# Patient Record
Sex: Male | Born: 1985 | Race: White | Hispanic: No | Marital: Single | Smoking: Never smoker
Health system: Southern US, Community
[De-identification: ages and names within clinical notes are randomized; demographics above are authoritative.]

## PROBLEM LIST (undated history)

## (undated) DIAGNOSIS — E271 Primary adrenocortical insufficiency: Secondary | ICD-10-CM

## (undated) HISTORY — PX: FRACTURE SURGERY: SHX138

## (undated) HISTORY — DX: Primary adrenocortical insufficiency: E27.1

## (undated) HISTORY — PX: WISDOM TOOTH EXTRACTION: SHX21

---

## 2005-02-27 ENCOUNTER — Emergency Department: Payer: Self-pay | Admitting: Emergency Medicine

## 2008-05-23 ENCOUNTER — Emergency Department: Payer: Self-pay | Admitting: Emergency Medicine

## 2008-11-07 ENCOUNTER — Emergency Department: Payer: Self-pay | Admitting: Emergency Medicine

## 2009-08-15 ENCOUNTER — Ambulatory Visit: Payer: Self-pay | Admitting: General Surgery

## 2010-03-23 ENCOUNTER — Emergency Department: Payer: Self-pay | Admitting: Emergency Medicine

## 2010-12-08 HISTORY — PX: EPIGASTRIC HERNIA REPAIR: SUR1177

## 2011-02-11 ENCOUNTER — Emergency Department: Payer: Self-pay | Admitting: Emergency Medicine

## 2011-10-15 ENCOUNTER — Inpatient Hospital Stay: Payer: Self-pay | Admitting: Internal Medicine

## 2014-12-19 ENCOUNTER — Emergency Department: Payer: Self-pay | Admitting: Emergency Medicine

## 2014-12-19 LAB — COMPREHENSIVE METABOLIC PANEL
ALK PHOS: 77 U/L
Albumin: 4 g/dL (ref 3.4–5.0)
Anion Gap: 8 (ref 7–16)
BUN: 11 mg/dL (ref 7–18)
Bilirubin,Total: 2.1 mg/dL — ABNORMAL HIGH (ref 0.2–1.0)
CALCIUM: 8.5 mg/dL (ref 8.5–10.1)
CHLORIDE: 103 mmol/L (ref 98–107)
Co2: 25 mmol/L (ref 21–32)
Creatinine: 0.99 mg/dL (ref 0.60–1.30)
EGFR (African American): >60
Glucose: 73 mg/dL (ref 65–99)
Osmolality: 270 (ref 275–301)
Potassium: 3.7 mmol/L (ref 3.5–5.1)
SGOT(AST): 37 U/L (ref 15–37)
SGPT (ALT): 27 U/L
SODIUM: 136 mmol/L (ref 136–145)
Total Protein: 7.1 g/dL (ref 6.4–8.2)

## 2014-12-19 LAB — CBC
HCT: 42.9 % (ref 40.0–52.0)
HGB: 14.4 g/dL (ref 13.0–18.0)
MCH: 29 pg (ref 26.0–34.0)
MCHC: 33.4 g/dL (ref 32.0–36.0)
MCV: 87 fL (ref 80–100)
Platelet: 264 10*3/uL (ref 150–440)
RBC: 4.96 10*6/uL (ref 4.40–5.90)
RDW: 12.7 % (ref 11.5–14.5)
WBC: 4.2 10*3/uL (ref 3.8–10.6)

## 2014-12-19 LAB — IRON: Iron: 87 ug/dL (ref 65–175)

## 2014-12-19 LAB — FERRITIN: FERRITIN (ARMC): 224 ng/mL (ref 8–388)

## 2014-12-25 ENCOUNTER — Emergency Department: Payer: Self-pay | Admitting: Emergency Medicine

## 2014-12-25 LAB — COMPREHENSIVE METABOLIC PANEL
ALT: 44 U/L
Albumin: 4.6 g/dL (ref 3.4–5.0)
Alkaline Phosphatase: 81 U/L
Anion Gap: 9 (ref 7–16)
BUN: 17 mg/dL (ref 7–18)
Bilirubin,Total: 1.4 mg/dL — ABNORMAL HIGH (ref 0.2–1.0)
CALCIUM: 9.5 mg/dL (ref 8.5–10.1)
CO2: 25 mmol/L (ref 21–32)
Chloride: 100 mmol/L (ref 98–107)
Creatinine: 1 mg/dL (ref 0.60–1.30)
Glucose: 114 mg/dL — ABNORMAL HIGH (ref 65–99)
Osmolality: 271 (ref 275–301)
Potassium: 4 mmol/L (ref 3.5–5.1)
SGOT(AST): 41 U/L — ABNORMAL HIGH (ref 15–37)
Sodium: 134 mmol/L — ABNORMAL LOW (ref 136–145)
Total Protein: 8.2 g/dL (ref 6.4–8.2)

## 2014-12-25 LAB — CBC WITH DIFFERENTIAL/PLATELET
Basophil #: 0 10*3/uL (ref 0.0–0.1)
Basophil %: 0.3 %
EOS ABS: 0.2 10*3/uL (ref 0.0–0.7)
Eosinophil %: 1.4 %
HCT: 50.9 % (ref 40.0–52.0)
HGB: 16.9 g/dL (ref 13.0–18.0)
Lymphocyte #: 0.9 10*3/uL — ABNORMAL LOW (ref 1.0–3.6)
Lymphocyte %: 7.7 %
MCH: 29 pg (ref 26.0–34.0)
MCHC: 33.2 g/dL (ref 32.0–36.0)
MCV: 87 fL (ref 80–100)
Monocyte #: 0.3 x10 3/mm (ref 0.2–1.0)
Monocyte %: 2.1 %
NEUTROS PCT: 88.5 %
Neutrophil #: 10.7 10*3/uL — ABNORMAL HIGH (ref 1.4–6.5)
PLATELETS: 295 10*3/uL (ref 150–440)
RBC: 5.82 10*6/uL (ref 4.40–5.90)
RDW: 12.7 % (ref 11.5–14.5)
WBC: 12.1 10*3/uL — ABNORMAL HIGH (ref 3.8–10.6)

## 2014-12-25 LAB — URINALYSIS, COMPLETE
Bacteria: NONE SEEN
Bilirubin,UR: NEGATIVE
Blood: NEGATIVE
Glucose,UR: NEGATIVE mg/dL (ref 0–75)
KETONE: NEGATIVE
Leukocyte Esterase: NEGATIVE
Nitrite: NEGATIVE
PH: 5 (ref 4.5–8.0)
PROTEIN: NEGATIVE
RBC,UR: <1 /HPF (ref 0–5)
SPECIFIC GRAVITY: 1.023 (ref 1.003–1.030)
Squamous Epithelial: <1
WBC UR: 1 /HPF (ref 0–5)

## 2014-12-25 LAB — LIPASE, BLOOD: Lipase: 94 U/L (ref 73–393)

## 2016-07-07 ENCOUNTER — Other Ambulatory Visit: Payer: Self-pay | Admitting: Surgery

## 2016-07-07 DIAGNOSIS — S43432A Superior glenoid labrum lesion of left shoulder, initial encounter: Secondary | ICD-10-CM

## 2016-07-07 DIAGNOSIS — M7582 Other shoulder lesions, left shoulder: Secondary | ICD-10-CM

## 2016-07-07 DIAGNOSIS — S46912A Strain of unspecified muscle, fascia and tendon at shoulder and upper arm level, left arm, initial encounter: Secondary | ICD-10-CM | POA: Diagnosis not present

## 2016-07-07 DIAGNOSIS — M25512 Pain in left shoulder: Secondary | ICD-10-CM | POA: Diagnosis not present

## 2016-07-22 ENCOUNTER — Ambulatory Visit
Admission: RE | Admit: 2016-07-22 | Discharge: 2016-07-22 | Disposition: A | Discharge status: Home / Self Care | Payer: BLUE CROSS/BLUE SHIELD | Source: Ambulatory Visit | Attending: Surgery | Admitting: Surgery

## 2016-07-22 ENCOUNTER — Encounter
Admission: RE | Admit: 2016-07-22 | Discharge: 2016-07-22 | Disposition: A | Discharge status: Home / Self Care | Payer: BLUE CROSS/BLUE SHIELD | Source: Ambulatory Visit | Attending: Surgery | Admitting: Surgery

## 2016-07-22 DIAGNOSIS — M25512 Pain in left shoulder: Secondary | ICD-10-CM | POA: Insufficient documentation

## 2016-07-22 DIAGNOSIS — M7582 Other shoulder lesions, left shoulder: Secondary | ICD-10-CM

## 2016-07-22 DIAGNOSIS — S43432A Superior glenoid labrum lesion of left shoulder, initial encounter: Secondary | ICD-10-CM | POA: Insufficient documentation

## 2016-07-22 DIAGNOSIS — S46912A Strain of unspecified muscle, fascia and tendon at shoulder and upper arm level, left arm, initial encounter: Secondary | ICD-10-CM

## 2016-07-22 MED ORDER — LIDOCAINE HCL (PF) 1 % IJ SOLN
5.0000 mL | Freq: Once | INTRAMUSCULAR | Status: DC
  Filled 2016-07-22: qty 5

## 2016-07-22 MED ORDER — IOPAMIDOL (ISOVUE-300) INJECTION 61%: 5.0000 mL | Freq: Once | INTRAVENOUS | Status: DC | PRN

## 2016-07-22 MED ORDER — GADOBENATE DIMEGLUMINE 529 MG/ML IV SOLN
0.1000 mL | Freq: Once | INTRAVENOUS | Status: AC | PRN
  Administered 2016-07-22: 0.1 mL via INTRAVENOUS

## 2016-08-26 DIAGNOSIS — S46102A Unspecified injury of muscle, fascia and tendon of long head of biceps, left arm, initial encounter: Secondary | ICD-10-CM | POA: Insufficient documentation

## 2016-09-10 DIAGNOSIS — E271 Primary adrenocortical insufficiency: Secondary | ICD-10-CM | POA: Diagnosis not present

## 2017-07-06 ENCOUNTER — Ambulatory Visit
Admission: RE | Admit: 2017-07-06 | Discharge: 2017-07-06 | Disposition: A | Discharge status: Home / Self Care | Payer: BLUE CROSS/BLUE SHIELD | Source: Ambulatory Visit | Attending: Family Medicine | Admitting: Family Medicine

## 2017-07-06 ENCOUNTER — Telehealth: Payer: Self-pay | Admitting: Family Medicine

## 2017-07-06 ENCOUNTER — Encounter: Payer: Self-pay | Admitting: Family Medicine

## 2017-07-06 ENCOUNTER — Inpatient Hospital Stay: Admit: 2017-07-06 | Payer: Self-pay

## 2017-07-06 ENCOUNTER — Ambulatory Visit (INDEPENDENT_AMBULATORY_CARE_PROVIDER_SITE_OTHER): Payer: BLUE CROSS/BLUE SHIELD | Admitting: Family Medicine

## 2017-07-06 VITALS — BP 125/81 | HR 81 | Temp 98.5°F | Ht 66.0 in | Wt 156.0 lb

## 2017-07-06 DIAGNOSIS — M4184 Other forms of scoliosis, thoracic region: Secondary | ICD-10-CM | POA: Insufficient documentation

## 2017-07-06 DIAGNOSIS — R0602 Shortness of breath: Secondary | ICD-10-CM | POA: Diagnosis not present

## 2017-07-06 DIAGNOSIS — E271 Primary adrenocortical insufficiency: Secondary | ICD-10-CM | POA: Insufficient documentation

## 2017-07-06 MED ORDER — ALBUTEROL SULFATE (2.5 MG/3ML) 0.083% IN NEBU
2.5000 mg | INHALATION_SOLUTION | Freq: Once | RESPIRATORY_TRACT | Status: AC
  Administered 2017-07-06: 2.5 mg via RESPIRATORY_TRACT

## 2017-07-06 NOTE — Progress Notes (Signed)
BP 125/81   Pulse 81   Temp 98.5 F (36.9 C)   Ht 5\' 6"  (1.676 m)   Wt 156 lb (70.8 kg)   SpO2 96%   BMI 25.18 kg/m    Subjective:    Patient ID: Gregory Wood Jawad Jr., male    DOB: 1986/05/06, 31 y.o.   MRN: [id number]  HPI: Gregory CableDouglas Wood Pendelton Jr. is a 31 y.o. male  Chief Complaint  Patient presents with  . Shortness of Breath    started when he was prescribed Cortef approx 2 years ago, but it comes and goes. Feels like he can't take a deep breath.    Patient with 2 years of intermittent episodes of chest tightness and SOB. These sxs started once he started the cortef after being diagnosed with Addison's disease. No apparent pattern for flares, and states they are worst when he first wakes up and seem to improve with activity. Does have dry coughing spells with these episodes. Denies reflux, exercise intolerance, CP, hx of allergic rhinitis, asthma, or other pulmonary dz. Never smoked cigarettes. Does occasionally smoke marijuana and actually notes benefit from this regarding his breathing.   Not currently having sxs, but states yesterday had severe sxs to the point where he thought about going to the hospital. Has discussed this issue with Endocrinologist at times who have recommended he see Pulmonology but pt never followed up since sxs dissipated at that time.  Relevant past medical, surgical, family and social history reviewed and updated as indicated. Interim medical history since our last visit reviewed. Allergies and medications reviewed and updated.  Review of Systems  Constitutional: Negative.   HENT: Negative.   Eyes: Negative.   Respiratory: Positive for cough, chest tightness and shortness of breath. Negative for wheezing.   Cardiovascular: Negative.   Gastrointestinal: Negative.   Musculoskeletal: Negative.   Neurological: Negative.   Psychiatric/Behavioral: Negative.    Per HPI unless specifically indicated above     Objective:    BP 125/81   Pulse 81    Temp 98.5 F (36.9 C)   Ht 5\' 6"  (1.676 m)   Wt 156 lb (70.8 kg)   SpO2 96%   BMI 25.18 kg/m   Wt Readings from Last 3 Encounters:  07/06/17 156 lb (70.8 kg)    Physical Exam  Constitutional: He is oriented to person, place, and time. He appears well-developed and well-nourished. No distress.  HENT:  Head: Atraumatic.  Nose: Nose normal.  Mouth/Throat: Oropharynx is clear and moist.  Eyes: Pupils are equal, round, and reactive to light. Conjunctivae are normal. No scleral icterus.  Neck: Normal range of motion. Neck supple.  Cardiovascular: Normal rate, regular rhythm and normal heart sounds.   Pulmonary/Chest: Effort normal and breath sounds normal. No respiratory distress. He has no wheezes. He has no rales.  Musculoskeletal: Normal range of motion.  Neurological: He is alert and oriented to person, place, and time.  Skin: Skin is warm and dry.  Psychiatric: He has a normal mood and affect. His behavior is normal.  Nursing note and vitals reviewed.     Assessment & Plan:   Problem List Items Addressed This Visit    None    Visit Diagnoses    SOB (shortness of breath)    -  Primary   Spirometry with moderate restriction not improved w/ neb. Will obtain CXR and refer to pulmonology for further eval.    Relevant Medications   albuterol (PROVENTIL) (2.5 MG/3ML) 0.083% nebulizer solution  2.5 mg (Completed)   Other Relevant Orders   Spirometry with Graph (Completed)   PR DEMO &/OR EVAL,PT USE,AEROSOL DEVICE   DG Chest 2 View (Completed)   Ambulatory referral to Pulmonology       Follow up plan: Return for Pulmonology.

## 2017-07-06 NOTE — Telephone Encounter (Signed)
Patient notified

## 2017-07-06 NOTE — Telephone Encounter (Signed)
Please call and let him know chest x-ray came back normal

## 2017-07-06 NOTE — Patient Instructions (Signed)
Follow up depending on results 

## 2017-07-07 ENCOUNTER — Encounter: Payer: Self-pay | Admitting: Internal Medicine

## 2017-07-07 ENCOUNTER — Ambulatory Visit (INDEPENDENT_AMBULATORY_CARE_PROVIDER_SITE_OTHER): Payer: BLUE CROSS/BLUE SHIELD | Admitting: Internal Medicine

## 2017-07-07 VITALS — BP 138/80 | HR 89 | Resp 16 | Ht 66.0 in | Wt 157.0 lb

## 2017-07-07 DIAGNOSIS — G4719 Other hypersomnia: Secondary | ICD-10-CM | POA: Diagnosis not present

## 2017-07-07 DIAGNOSIS — R0602 Shortness of breath: Secondary | ICD-10-CM | POA: Diagnosis not present

## 2017-07-07 MED ORDER — TIOTROPIUM BROMIDE MONOHYDRATE 1.25 MCG/ACT IN AERS: 2.0000 | INHALATION_SPRAY | Freq: Two times a day (BID) | RESPIRATORY_TRACT | 5 refills | Status: DC

## 2017-07-07 NOTE — Patient Instructions (Addendum)
CT chest without Contrast Will need SLeep study

## 2017-07-07 NOTE — Progress Notes (Signed)
Name: Gregory CableDouglas L Ericksen Jr. MRN: [id number] DOB: [birth year]     CONSULTATION DATE: 07/07/17  REFERRING MD : Dossie Arbourcrissman CHIEF COMPLAINT:  Shortness breath  STUDIES:  Chest x-ray on 07/06/2017 reviewed on 07/07/2017 Interpretation no acute infiltrates noted however there is evidence of scoliosis   HISTORY OF PRESENT ILLNESS:   31 year old white male presents today with waking up with shortness of breath it's been going on for the last several months has associated wheezing and cough Patient has been having occasional dyspnea exertion patient has been exposed to secondhand smoke in the past Patient does use marijuana occasionally Patient has no diagnosis of asthma as child  He is on chronic hydrocortisone therapy for Addison's disease Patient has gained 40 pounds in the last 2 years He does have excessive daytime sleepiness and generalized fatigue throughout the day Patient has occasional gasping for air episodes in the middle of the night Epworth sleep score is 13  Chest x-ray does not show any acute infiltrates just some spinal abnormalities with scoliosis however his PFT function is significant for a moderate restrictive lung disease  At this time does not have any signs of infection at this time He does not have any signs of acute heart failure at this time  PAST MEDICAL HISTORY :   has a past medical history of Addison's disease (HCC).  has a past surgical history that includes Wisdom tooth extraction; Fracture surgery; and Epigastric hernia repair (2012). Prior to Admission medications   Medication Sig Start Date End Date Taking? Authorizing Provider  hydrocortisone (CORTEF) 10 MG tablet Take 10 mg by mouth. Take 1.5 in the morning and 1 in the evening. 06/09/17  Yes [provider]   No Known Allergies  FAMILY HISTORY:  family history includes Diabetes in his father; Hypertension in his mother; Stroke in his paternal grandfather. SOCIAL HISTORY:  reports that he  has never smoked. He has never used smokeless tobacco. He reports that he uses drugs, including Marijuana. He reports that he does not drink alcohol.  REVIEW OF SYSTEMS:   Constitutional: Negative for fever, chills, weight loss, +malaise/fatigue and diaphoresis.  HENT: Negative for hearing loss, ear pain, nosebleeds, congestion, sore throat, neck pain, tinnitus and ear discharge.   Eyes: Negative for blurred vision, double vision, photophobia, pain, discharge and redness.  Respiratory: Negative for cough, hemoptysis, sputum production, +shortness of breath, +wheezing and stridor.   Cardiovascular: Negative for chest pain, palpitations, orthopnea, claudication, leg swelling and PND.  Gastrointestinal: Negative for heartburn, nausea, vomiting, abdominal pain, diarrhea, constipation, blood in stool and melena.  Genitourinary: Negative for dysuria, urgency, frequency, hematuria and flank pain.  Musculoskeletal: Negative for myalgias, back pain, joint pain and falls.  Skin: Negative for itching and rash.  Neurological: Negative for dizziness, tingling, tremors, sensory change, speech change, focal weakness, seizures, loss of consciousness, weakness and headaches.  Endo/Heme/Allergies: Negative for environmental allergies and polydipsia. Does not bruise/bleed easily.  ALL OTHER ROS ARE NEGATIVE  BP 138/80 (BP Location: Left Arm, Cuff Size: Normal)   Pulse 89   Resp 16   Ht 5\' 6"  (1.676 m)   Wt 157 lb (71.2 kg)   SpO2 96%   BMI 25.34 kg/m    Physical Examination:   GENERAL:NAD, no fevers, chills, no weakness no fatigue HEAD: Normocephalic, atraumatic.  EYES: Pupils equal, round, reactive to light. Extraocular muscles intact. No scleral icterus.  MOUTH: Moist mucosal membrane.   EAR, NOSE, THROAT: Clear without exudates. No external lesions.  NECK: Supple.  No thyromegaly. No nodules. No JVD.  PULMONARY:CTA B/L no wheezes, no crackles, no rhonchi CARDIOVASCULAR: S1 and S2. Regular rate and  rhythm. No murmurs, rubs, or gallops. No edema.  GASTROINTESTINAL: Soft, nontender, nondistended. No masses. Positive bowel sounds.  MUSCULOSKELETAL: No swelling, clubbing, or edema. Range of motion full in all extremities.  NEUROLOGIC: Cranial nerves II through XII are intact. No gross focal neurological deficits.  SKIN: No ulceration, lesions, rashes, or cyanosis. Skin warm and dry. Turgor intact.  PSYCHIATRIC: Mood, affect within normal limits. The patient is awake, alert and oriented x 3. Insight, judgment intact.     ASSESSMENT / PLAN: 31 year old pleasant white male seen today for shortness of breath wheezing and cough which is most likely related to underlying reactive airways disease with probable underlying asthma in the setting of a diagnosis of Addison's disease on chronic steroid therapy which T 40 pound weight gain and also has symptoms of excessive daytime sleepiness and fatigue throughout the day with respiratory symptoms at nighttime which may suggest underlying obstructive sleep apnea  #1 shortness of breath and wheezing and cough likely related to reactive airways disease Patient advised to avoid secondhand smoke Patient advised to stay away from marijuana use Will show patient how to use Spiriva Respimat 1.25  #2 moderate restrictive lung disease as per office spirometry done at his primary care office This is unusual for a young male even with scoliosis Will need CT of his chest to assess for interstitial lung disease   #3 excessive daytime sleepiness in the setting of fatigue along with respiratory difficulty intermittent at nighttime Findings suggest underlying obstructive sleep apnea Patient will need sleep study as soon as possible  Patient  satisfied with Plan of action and management. All questions answered  Lucie LeatherKurian David Jayd Forrey, M.D.  [facility]  Medical Director [facility] Medical Director [facility]

## 2017-08-05 ENCOUNTER — Ambulatory Visit: Payer: BLUE CROSS/BLUE SHIELD | Attending: Internal Medicine

## 2017-08-05 DIAGNOSIS — R400 Somnolence: Secondary | ICD-10-CM | POA: Insufficient documentation

## 2017-08-05 DIAGNOSIS — E271 Primary adrenocortical insufficiency: Secondary | ICD-10-CM | POA: Diagnosis not present

## 2017-08-05 DIAGNOSIS — G4761 Periodic limb movement disorder: Secondary | ICD-10-CM | POA: Insufficient documentation

## 2017-08-05 DIAGNOSIS — G4714 Hypersomnia due to medical condition: Secondary | ICD-10-CM | POA: Diagnosis not present

## 2017-08-07 ENCOUNTER — Telehealth: Payer: Self-pay | Admitting: *Deleted

## 2017-08-07 NOTE — Telephone Encounter (Signed)
LMOM for pt to call back for results

## 2017-08-07 NOTE — Telephone Encounter (Signed)
-----   Message from Shane CrutchPradeep Ramachandran, MD sent at 08/06/2017  5:25 PM EDT ----- Regarding: Sleep study results.  Sleep study negative for OSA: recommend:  Follow with referring physician .Educate patient in sleep hygiene measures .If OSA is still a concern would consider home sleep testing which may have a better sensitivity at detecting this patient's mild OSA.

## 2017-08-11 NOTE — Telephone Encounter (Signed)
LMOM for pt to return call. 

## 2017-08-12 NOTE — Telephone Encounter (Signed)
Pt informed of sleep results. Pt scheduled to discuss sleep techniques with DK. Nothing further needed.

## 2017-08-20 ENCOUNTER — Encounter: Payer: Self-pay | Admitting: Internal Medicine

## 2017-08-20 ENCOUNTER — Ambulatory Visit (INDEPENDENT_AMBULATORY_CARE_PROVIDER_SITE_OTHER): Payer: BLUE CROSS/BLUE SHIELD | Admitting: Internal Medicine

## 2017-08-20 VITALS — BP 118/90 | HR 91 | Ht 66.0 in | Wt 158.0 lb

## 2017-08-20 DIAGNOSIS — J4520 Mild intermittent asthma, uncomplicated: Secondary | ICD-10-CM | POA: Diagnosis not present

## 2017-08-20 MED ORDER — OMEPRAZOLE 20 MG PO CPDR: 20.0000 mg | DELAYED_RELEASE_CAPSULE | Freq: Every day | ORAL | 5 refills | Status: DC

## 2017-08-20 NOTE — Progress Notes (Signed)
Name: Gregory CableDouglas L Sky Jr. MRN: [id number] DOB: February 15, 1986     CONSULTATION DATE: 07/07/17  REFERRING MD : Dossie Arbourcrissman CHIEF COMPLAINT:  Shortness breath  STUDIES:  Chest x-ray on 07/06/2017 reviewed on 07/07/2017 Interpretation no acute infiltrates noted however there is evidence of scoliosis   HISTORY OF PRESENT ILLNESS:   patient states that his wheezing has improved since he changed mattresses I had started Spiriva. At last visit. However, he did not continue this medicine as it  seemed it did not help He also has h/o GERD and has stopped taking his Prilosec when he started taking his steroids He noticed more GERD symptoms    Patient has no diagnosis of asthma as child  He is on chronic hydrocortisone therapy for Addison's disease Patient has gained 40 pounds in the last 2 years Patient does not have sleep apnea  Chest x-ray does not show any acute infiltrates just some spinal abnormalities with scoliosis however his PFT function is significant for a moderate restrictive lung disease  At this time does not have any signs of infection at this time He does not have any signs of acute heart failure at this time   REVIEW OF SYSTEMS:   Constitutional: Negative for fever, chills, weight loss, -malaise/fatigue and diaphoresis.  HENT: Negative for hearing loss, ear pain, nosebleeds, congestion, sore throat, neck pain, tinnitus and ear discharge.   Eyes: Negative for blurred vision, double vision, photophobia, pain, discharge and redness.  Respiratory: Negative for cough, hemoptysis, sputum production, -shortness of breath, -wheezing and stridor.   Cardiovascular: Negative for chest pain, palpitations, orthopnea, claudication, leg swelling and PND.  Gastrointestinal: +heartburn,  ALL OTHER ROS ARE NEGATIVE  BP 118/90 (BP Location: Left Arm, Cuff Size: Normal)   Pulse 91   Ht 5\' 6"  (1.676 m)   Wt 158 lb (71.7 kg)   SpO2 97%   BMI 25.50 kg/m    Physical Examination:    GENERAL:NAD, no fevers, chills, no weakness no fatigue HEAD: Normocephalic, atraumatic.  EYES: Pupils equal, round, reactive to light. Extraocular muscles intact. No scleral icterus.  MOUTH: Moist mucosal membrane.   EAR, NOSE, THROAT: Clear without exudates. No external lesions.  NECK: Supple. No thyromegaly. No nodules. No JVD.  PULMONARY:CTA B/L no wheezes, no crackles, no rhonchi CARDIOVASCULAR: S1 and S2. Regular rate and rhythm. No murmurs, rubs, or gallops. No edema.     ASSESSMENT / PLAN: 31 year old pleasant white male seen today for shortness of breath wheezing and cough which is most likely related to underlying reactive airways disease with probable underlying asthma in the setting of a diagnosis of Addison's disease on chronic steroid therapy, associated with uncontrolled GERD  #1 shortness of breath and wheezing and cough likely related to reactive airways disease Patient advised to avoid secondhand smoke Patient advised to stay away from marijuana use Patient with uncontrolled GERD  #2 moderate restrictive lung disease as per office spirometry done at his primary care office This is unusual for a young male even with scoliosis CT of his chest to assess for interstitial lung disease to be discussed at next visit since his wheezing and SOB improved  #3 . Uncontrolled GERD Patient advised to restart Prilosec as this could be related to his morning wheezing and feeling sick to his stomach  Patient  satisfied with Plan of action and management. All questions answered Follow-up in 6 months  Gregory Wood Gladavid Reinhart Saulters, M.D.  [facility]  Medical Director [facility] Medical Director  [facility] Cardio-Pulmonary Department

## 2017-08-20 NOTE — Patient Instructions (Signed)
Restart Prilosec therapy for reflux disease

## 2017-08-20 NOTE — Addendum Note (Signed)
Addended by: Alease FrameARTER, Briannah Lona S on: 08/20/2017 04:10 PM   Modules accepted: Orders

## 2017-09-30 DIAGNOSIS — E271 Primary adrenocortical insufficiency: Secondary | ICD-10-CM | POA: Diagnosis not present

## 2017-10-05 DIAGNOSIS — E271 Primary adrenocortical insufficiency: Secondary | ICD-10-CM | POA: Diagnosis not present

## 2018-02-19 ENCOUNTER — Encounter: Payer: Self-pay | Admitting: Internal Medicine

## 2018-02-26 ENCOUNTER — Other Ambulatory Visit: Payer: Self-pay | Admitting: Internal Medicine

## 2018-02-26 MED ORDER — OMEPRAZOLE 20 MG PO CPDR: 20.0000 mg | DELAYED_RELEASE_CAPSULE | Freq: Every day | ORAL | 1 refills | Status: DC

## 2018-02-26 NOTE — Telephone Encounter (Signed)
Paper refill request from [facility] pharmacy. 

## 2018-03-02 ENCOUNTER — Other Ambulatory Visit: Payer: Self-pay

## 2018-03-02 MED ORDER — OMEPRAZOLE 20 MG PO CPDR: 20.0000 mg | DELAYED_RELEASE_CAPSULE | Freq: Every day | ORAL | 0 refills | Status: DC

## 2018-03-02 NOTE — Telephone Encounter (Signed)
Received 90 day supply request for Prilosec 20mg . Rx for Prilosec was sent in on 02/26/18 for 30 day supply. Rx for 90 day supply has been sent to preferred pharmacy.  Nothing further is needed.

## 2019-01-12 DIAGNOSIS — E271 Primary adrenocortical insufficiency: Secondary | ICD-10-CM | POA: Diagnosis not present

## 2019-05-20 DIAGNOSIS — Z20828 Contact with and (suspected) exposure to other viral communicable diseases: Secondary | ICD-10-CM | POA: Diagnosis not present

## 2020-01-24 DIAGNOSIS — Z20828 Contact with and (suspected) exposure to other viral communicable diseases: Secondary | ICD-10-CM | POA: Diagnosis not present

## 2020-03-06 ENCOUNTER — Encounter: Payer: Self-pay | Admitting: Family Medicine

## 2020-03-06 ENCOUNTER — Ambulatory Visit (INDEPENDENT_AMBULATORY_CARE_PROVIDER_SITE_OTHER): Payer: BC Managed Care – PPO | Admitting: Family Medicine

## 2020-03-06 ENCOUNTER — Other Ambulatory Visit: Payer: Self-pay

## 2020-03-06 VITALS — BP 105/68 | HR 76 | Temp 98.2°F

## 2020-03-06 DIAGNOSIS — L739 Follicular disorder, unspecified: Secondary | ICD-10-CM

## 2020-03-06 MED ORDER — SULFAMETHOXAZOLE-TRIMETHOPRIM 800-160 MG PO TABS: 1.0000 | ORAL_TABLET | Freq: Two times a day (BID) | ORAL | 0 refills | Status: DC

## 2020-03-06 NOTE — Progress Notes (Signed)
BP 105/68 (BP Location: Left Arm, Patient Position: Sitting, Cuff Size: Normal)   Pulse 76   Temp 98.2 F (36.8 C) (Oral)   SpO2 97%    Subjective:    Patient ID: Gregory Cable., male    DOB: Apr 09, 1986, 34 y.o.   MRN: [id number]  HPI: Gregory Gillian. is a 34 y.o. male  No chief complaint on file.  SKIN INFECTION Duration: few days Location: tip of his nose History of trauma in area: no Pain: yes Quality: aching and sore Severity: mildH3 Redness: yes Swelling: yes Oozing: no Pus: no Fevers: no Nausea/vomiting: no Status: better Treatments attempted:warm compresses  Tetanus: unknown   Relevant past medical, surgical, family and social history reviewed and updated as indicated. Interim medical history since our last visit reviewed. Allergies and medications reviewed and updated.  Review of Systems  Constitutional: Negative.   HENT: Negative.   Respiratory: Negative.   Cardiovascular: Negative.   Gastrointestinal: Negative.   Musculoskeletal: Negative.   Skin: Positive for color change. Negative for pallor, rash and wound.  Neurological: Negative.   Psychiatric/Behavioral: Negative.     Per HPI unless specifically indicated above     Objective:    BP 105/68 (BP Location: Left Arm, Patient Position: Sitting, Cuff Size: Normal)   Pulse 76   Temp 98.2 F (36.8 C) (Oral)   SpO2 97%   Wt Readings from Last 3 Encounters:  08/20/17 158 lb (71.7 kg)  07/07/17 157 lb (71.2 kg)  07/06/17 156 lb (70.8 kg)    Physical Exam Vitals and nursing note reviewed.  Constitutional:      General: He is not in acute distress.    Appearance: Normal appearance. He is not ill-appearing, toxic-appearing or diaphoretic.  HENT:     Head: Normocephalic and atraumatic.     Right Ear: External ear normal.     Left Ear: External ear normal.     Nose: Nose normal.     Mouth/Throat:     Mouth: Mucous membranes are moist.     Pharynx: Oropharynx is clear.  Eyes:     General: No scleral icterus.       Right eye: No discharge.        Left eye: No discharge.     Extraocular Movements: Extraocular movements intact.     Conjunctiva/sclera: Conjunctivae normal.     Pupils: Pupils are equal, round, and reactive to light.  Cardiovascular:     Rate and Rhythm: Normal rate and regular rhythm.     Pulses: Normal pulses.     Heart sounds: Normal heart sounds. No murmur. No friction rub. No gallop.   Pulmonary:     Effort: Pulmonary effort is normal. No respiratory distress.     Breath sounds: Normal breath sounds. No stridor. No wheezing, rhonchi or rales.  Chest:     Chest wall: No tenderness.  Musculoskeletal:        General: Normal range of motion.     Cervical back: Normal range of motion and neck supple.  Skin:    General: Skin is warm and dry.     Capillary Refill: Capillary refill takes less than 2 seconds.     Coloration: Skin is not jaundiced or pale.     Findings: Erythema (erythematous papule on the tip of his nose) present. No bruising, lesion or rash.  Neurological:     General: No focal deficit present.     Mental Status: He is alert and oriented  to person, place, and time. Mental status is at baseline.  Psychiatric:        Mood and Affect: Mood normal.        Behavior: Behavior normal.        Thought Content: Thought content normal.        Judgment: Judgment normal.     No results found for this or any previous visit.    Assessment & Plan:   Problem List Items Addressed This Visit    None    Visit Diagnoses    Folliculitis    -  Primary   Will treat with bactrim. Call if not getting better or getting worse. Continue to monitor.        Follow up plan: Return physical ASAP.

## 2020-03-14 ENCOUNTER — Encounter: Payer: BC Managed Care – PPO | Admitting: Family Medicine

## 2020-04-06 ENCOUNTER — Other Ambulatory Visit: Payer: Self-pay

## 2020-04-06 ENCOUNTER — Emergency Department
Admission: EM | Admit: 2020-04-06 | Discharge: 2020-04-06 | Disposition: A | Discharge status: Home / Self Care | Payer: No Typology Code available for payment source | Attending: Emergency Medicine | Admitting: Emergency Medicine

## 2020-04-06 ENCOUNTER — Emergency Department: Payer: No Typology Code available for payment source

## 2020-04-06 DIAGNOSIS — S61213A Laceration without foreign body of left middle finger without damage to nail, initial encounter: Secondary | ICD-10-CM | POA: Diagnosis present

## 2020-04-06 DIAGNOSIS — E271 Primary adrenocortical insufficiency: Secondary | ICD-10-CM | POA: Diagnosis not present

## 2020-04-06 DIAGNOSIS — Z23 Encounter for immunization: Secondary | ICD-10-CM | POA: Insufficient documentation

## 2020-04-06 DIAGNOSIS — W231XXA Caught, crushed, jammed, or pinched between stationary objects, initial encounter: Secondary | ICD-10-CM | POA: Insufficient documentation

## 2020-04-06 DIAGNOSIS — Z79899 Other long term (current) drug therapy: Secondary | ICD-10-CM | POA: Diagnosis not present

## 2020-04-06 DIAGNOSIS — Y9259 Other trade areas as the place of occurrence of the external cause: Secondary | ICD-10-CM | POA: Diagnosis not present

## 2020-04-06 DIAGNOSIS — Y9389 Activity, other specified: Secondary | ICD-10-CM | POA: Insufficient documentation

## 2020-04-06 DIAGNOSIS — Y990 Civilian activity done for income or pay: Secondary | ICD-10-CM | POA: Diagnosis not present

## 2020-04-06 MED ORDER — CEPHALEXIN 500 MG PO CAPS: 1000.0000 mg | ORAL_CAPSULE | Freq: Two times a day (BID) | ORAL | 0 refills | Status: DC

## 2020-04-06 MED ORDER — LIDOCAINE HCL (PF) 1 % IJ SOLN
10.0000 mL | Freq: Once | INTRAMUSCULAR | Status: AC
  Administered 2020-04-06: 10 mL
  Filled 2020-04-06: qty 10

## 2020-04-06 MED ORDER — TRAMADOL HCL 50 MG PO TABS: 50.0000 mg | ORAL_TABLET | Freq: Four times a day (QID) | ORAL | 0 refills | Status: DC | PRN

## 2020-04-06 MED ORDER — TETANUS-DIPHTH-ACELL PERTUSSIS 5-2.5-18.5 LF-MCG/0.5 IM SUSP
0.5000 mL | Freq: Once | INTRAMUSCULAR | Status: AC
Start: 2020-04-06 — End: 2020-04-06
  Administered 2020-04-06: 14:00:00 0.5 mL via INTRAMUSCULAR
  Filled 2020-04-06: qty 0.5

## 2020-04-06 NOTE — ED Triage Notes (Signed)
Laceration to left hand 3rd digit that occurred at work. Wants to file workers comp-works for [employer]. Full ROM to finger.

## 2020-04-06 NOTE — ED Provider Notes (Signed)
[facility] Emergency Department Provider Note  ____________________________________________  Time seen: Approximately 2:24 PM  I have reviewed the triage vital signs and the nursing notes.   HISTORY  Chief Complaint Laceration    HPI Gregory Wood. is a 34 y.o. male who presents the emergency department complaining of an injury to the middle finger of the left hand.  Patient was at work, was helping load a lawnmower into a truck when his hand became pinched between the lawn more and the roadway.  Patient sustained a laceration to the medial aspect of the third digit left hand.  Unsure of last tetanus shot.  No other injuries or complaints.         Past Medical History:  Diagnosis Date  . Addison's disease [facility])     Patient Active Problem List   Diagnosis Date Noted  . Addison disease (HCC)   . Injury of tendon of long head of left biceps 08/26/2016  . Rotator cuff tendinitis, left 07/07/2016  . Strain of left shoulder 07/07/2016    Past Surgical History:  Procedure Laterality Date  . EPIGASTRIC HERNIA REPAIR  2012  . FRACTURE SURGERY     ulnar  . WISDOM TOOTH EXTRACTION      Prior to Admission medications   Medication Sig Start Date End Date Taking? Authorizing Provider  cephALEXin (KEFLEX) 500 MG capsule Take 2 capsules (1,000 mg total) by mouth 2 (two) times daily. 04/06/20   Clea Dubach, Delorise Royals, PA-C  hydrocortisone (CORTEF) 10 MG tablet Take 10 mg by mouth. Take 1.5 in the morning and 1 in the evening. 06/09/17   [provider]  omeprazole (PRILOSEC) 20 MG capsule Take 1 capsule (20 mg total) by mouth daily. 03/02/18 03/02/19  Erin Fulling, MD  traMADol (ULTRAM) 50 MG tablet Take 1 tablet (50 mg total) by mouth every 6 (six) hours as needed. 04/06/20   Jolissa Kapral, Delorise Royals, PA-C    Allergies Patient has no known allergies.  Family History  Problem Relation Age of Onset  . Hypertension Mother   . Diabetes Father   .  Stroke Paternal Grandfather   . COPD Neg Hx     Social History Social History   Tobacco Use  . Smoking status: Never Smoker  . Smokeless tobacco: Never Used  Substance Use Topics  . Alcohol use: No  . Drug use: Yes    Types: Marijuana    Comment: rare     Review of Systems  Constitutional: No fever/chills Eyes: No visual changes. No discharge ENT: No upper respiratory complaints. Cardiovascular: no chest pain. Respiratory: no cough. No SOB. Gastrointestinal: No abdominal pain.  No nausea, no vomiting.  No diarrhea.  No constipation. Musculoskeletal: Laceration to the third digit of the left hand Skin: Negative for rash, abrasions, lacerations, ecchymosis. Neurological: Negative for headaches, focal weakness or numbness. 10-point ROS otherwise negative.  ____________________________________________   PHYSICAL EXAM:  VITAL SIGNS: ED Triage Vitals  Enc Vitals Group     BP 04/06/20 1222 (!) 146/90     Pulse Rate 04/06/20 1222 64     Resp 04/06/20 1222 18     Temp 04/06/20 1222 98.6 F (37 C)     Temp Source 04/06/20 1222 Oral     SpO2 04/06/20 1222 100 %     Weight 04/06/20 1223 145 lb (65.8 kg)     Height 04/06/20 1223 5\' 6"  (1.676 m)     Head Circumference --  Peak Flow --      Pain Score 04/06/20 1224 5     Pain Loc --      Pain Edu? --      Excl. in [place]? --      Constitutional: Alert and oriented. Well appearing and in no acute distress. Eyes: Conjunctivae are normal. PERRL. EOMI. Head: Atraumatic. ENT:      Ears:       Nose: No congestion/rhinnorhea.      Mouth/Throat: Mucous membranes are moist.  Neck: No stridor.    Cardiovascular: Normal rate, regular rhythm. Normal S1 and S2.  Good peripheral circulation. Respiratory: Normal respiratory effort without tachypnea or retractions. Lungs CTAB. Good air entry to the bases with no decreased or absent breath sounds. Musculoskeletal: Full range of motion to all extremities. No gross deformities  appreciated.  Soft tissue injury noted to the middle finger of the left hand.  Slightly ragged laceration appreciated to the medial aspect.  No visible foreign body.  No involvement of the nail.  Full range of motion.  This involves the medial aspect from the distal portion of the finger along the medial finger pad to the level of the DIP joint.  Sensation intact.  Capillary refill less than 2 seconds. Neurologic:  Normal speech and language. No gross focal neurologic deficits are appreciated.  Skin:  Skin is warm, dry and intact. No rash noted. Psychiatric: Mood and affect are normal. Speech and behavior are normal. Patient exhibits appropriate insight and judgement.   ____________________________________________   LABS (all labs ordered are listed, but only abnormal results are displayed)  Labs Reviewed - No data to display ____________________________________________  EKG   ____________________________________________  RADIOLOGY I personally viewed and evaluated these images as part of my medical decision making, as well as reviewing the written report by the radiologist.  DG Finger Middle Left  Result Date: 04/06/2020 CLINICAL DATA:  Pain following laceration EXAM: LEFT THIRD FINGER 2+V COMPARISON:  None. FINDINGS: Frontal, oblique, and lateral views were obtained. No fracture or dislocation. There is soft tissue injury distally. No soft tissue air or radiopaque foreign body. Joint spaces appear normal. No erosive change. IMPRESSION: Soft tissue injury. No bony abnormality. No radiopaque foreign body. No evident arthropathy. Electronically Signed   By: Lowella Grip III M.D.   On: 04/06/2020 13:06    ____________________________________________    PROCEDURES  Procedure(s) performed:    Marland KitchenMarland KitchenLaceration Repair  Date/Time: 04/06/2020 2:33 PM Performed by: Darletta Moll, PA-C Authorized by: Darletta Moll, PA-C   Consent:    Consent obtained:  Verbal    Consent given by:  Patient   Risks discussed:  Pain, poor cosmetic result, poor wound healing and infection Anesthesia (see MAR for exact dosages):    Anesthesia method:  Nerve block   Block location:  Middle finger   Block needle gauge:  27 G   Block anesthetic:  Lidocaine 1% w/o epi   Block technique:  Digital block   Block injection procedure:  Anatomic landmarks identified, introduced needle, negative aspiration for blood and incremental injection   Block outcome:  Anesthesia achieved Laceration details:    Location:  Finger   Finger location:  L long finger   Length (cm):  5 Repair type:    Repair type:  Simple Pre-procedure details:    Preparation:  Patient was prepped and draped in usual sterile fashion and imaging obtained to evaluate for foreign bodies Exploration:    Hemostasis achieved with:  Direct pressure  Wound exploration: wound explored through full range of motion and entire depth of wound probed and visualized     Wound extent: no foreign bodies/material noted, no muscle damage noted, no nerve damage noted, no tendon damage noted, no underlying fracture noted and no vascular damage noted     Contaminated: no   Treatment:    Area cleansed with:  Betadine and saline   Amount of cleaning:  Extensive   Irrigation solution:  Sterile saline   Irrigation volume:  500 ml   Irrigation method:  Syringe Skin repair:    Repair method:  Sutures   Suture size:  4-0   Suture material:  Nylon   Suture technique:  Simple interrupted   Number of sutures:  8 Approximation:    Approximation:  Close Post-procedure details:    Dressing:  Open (no dressing)   Patient tolerance of procedure:  Tolerated well, no immediate complications      Medications  Tdap (BOOSTRIX) injection 0.5 mL (0.5 mLs Intramuscular Given 04/06/20 1416)  lidocaine (PF) (XYLOCAINE) 1 % injection 10 mL (10 mLs Infiltration Given 04/06/20 1400)      ____________________________________________   INITIAL IMPRESSION / ASSESSMENT AND PLAN / ED COURSE  Pertinent labs & imaging results that were available during my care of the patient were reviewed by me and considered in my medical decision making (see chart for details).  Review of the [place] CSRS was performed in accordance of the [facility] prior to dispensing any controlled drugs.           Patient's diagnosis is consistent with finger laceration.  Patient presented to emergency department with a laceration to the middle finger of the left hand.  This was sustained at work.  Imaging reveals no foreign body or underlying fracture.  Laceration was closed as described above with no complications.  Follow-up with primary care urgent care in 1 week for suture removal.  Patient replaced on antibiotics and limited pain medication..  Patient is given ED precautions to return to the ED for any worsening or new symptoms.     ____________________________________________  FINAL CLINICAL IMPRESSION(S) / ED DIAGNOSES  Final diagnoses:  Laceration of left middle finger without foreign body without damage to nail, initial encounter      NEW MEDICATIONS STARTED DURING THIS VISIT:  ED Discharge Orders         Ordered    cephALEXin (KEFLEX) 500 MG capsule  2 times daily     04/06/20 1427    traMADol (ULTRAM) 50 MG tablet  Every 6 hours PRN     04/06/20 1427              This chart was dictated using voice recognition software/Dragon. Despite best efforts to proofread, errors can occur which can change the meaning. Any change was purely unintentional.    Racheal Patches, PA-C 04/06/20 1437    Phineas Semen, MD 04/06/20 1451

## 2020-04-16 ENCOUNTER — Other Ambulatory Visit: Payer: Self-pay

## 2020-04-16 ENCOUNTER — Ambulatory Visit: Payer: BC Managed Care – PPO | Admitting: Nurse Practitioner

## 2020-05-15 ENCOUNTER — Other Ambulatory Visit: Payer: Self-pay

## 2020-05-15 ENCOUNTER — Telehealth (INDEPENDENT_AMBULATORY_CARE_PROVIDER_SITE_OTHER): Payer: BC Managed Care – PPO | Admitting: Nurse Practitioner

## 2020-05-15 ENCOUNTER — Encounter: Payer: Self-pay | Admitting: Nurse Practitioner

## 2020-05-15 VITALS — Ht 66.0 in | Wt 145.0 lb

## 2020-05-15 DIAGNOSIS — R05 Cough: Secondary | ICD-10-CM

## 2020-05-15 DIAGNOSIS — J069 Acute upper respiratory infection, unspecified: Secondary | ICD-10-CM

## 2020-05-15 DIAGNOSIS — R059 Cough, unspecified: Secondary | ICD-10-CM

## 2020-05-15 MED ORDER — GUAIFENESIN ER 600 MG PO TB12: 600.0000 mg | ORAL_TABLET | Freq: Two times a day (BID) | ORAL | 0 refills | Status: DC | PRN

## 2020-05-15 NOTE — Assessment & Plan Note (Signed)
Acute, ongoing.  Given length of symptoms, and slight improvement, likely viral etiology.  Will continue to treat conservatively with rest, hydration, OTC medications, and start guaifenesin.  Encouraged to either obtain covid-19 testing and/or self-quarantine for 14 days.  If not better by early next week, consider antibiotics.  If symptoms worsen, return to clinic.

## 2020-05-15 NOTE — Patient Instructions (Signed)
Viral Respiratory Infection A viral respiratory infection is an illness that affects parts of the body that are used for breathing. These include the lungs, nose, and throat. It is caused by a germ called a virus. Some examples of this kind of infection are:  A cold.  The flu (influenza).  A respiratory syncytial virus (RSV) infection. A person who gets this illness may have the following symptoms:  A stuffy or runny nose.  Yellow or green fluid in the nose.  A cough.  Sneezing.  Tiredness (fatigue).  Achy muscles.  A sore throat.  Sweating or chills.  A fever.  A headache. Follow these instructions at home: Managing pain and congestion  Take over-the-counter and prescription medicines only as told by your doctor.  If you have a sore throat, gargle with salt water. Do this 3-4 times per day or as needed. To make a salt-water mixture, dissolve -1 tsp of salt in 1 cup of warm water. Make sure that all the salt dissolves.  Use nose drops made from salt water. This helps with stuffiness (congestion). It also helps soften the skin around your nose.  Drink enough fluid to keep your pee (urine) pale yellow. General instructions   Rest as much as possible.  Do not drink alcohol.  Do not use any products that have nicotine or tobacco, such as cigarettes and e-cigarettes. If you need help quitting, ask your doctor.  Keep all follow-up visits as told by your doctor. This is important. How is this prevented?   Get a flu shot every year. Ask your doctor when you should get your flu shot.  Do not let other people get your germs. If you are sick: ? Stay home from work or school. ? Wash your hands with soap and water often. Wash your hands after you cough or sneeze. If soap and water are not available, use hand sanitizer.  Avoid contact with people who are sick during cold and flu season. This is in fall and winter. Get help if:  Your symptoms last for 10 days or  longer.  Your symptoms get worse over time.  You have a fever.  You have very bad pain in your face or forehead.  Parts of your jaw or neck become very swollen. Get help right away if:  You feel pain or pressure in your chest.  You have shortness of breath.  You faint or feel like you will faint.  You keep throwing up (vomiting).  You feel confused. Summary  A viral respiratory infection is an illness that affects parts of the body that are used for breathing.  Examples of this illness include a cold, the flu, and respiratory syncytial virus (RSV) infection.  The infection can cause a runny nose, cough, sneezing, sore throat, and fever.  Follow what your doctor tells you about taking medicines, drinking lots of fluid, washing your hands, resting at home, and avoiding people who are sick. This information is not intended to replace advice given to you by your health care provider. Make sure you discuss any questions you have with your health care provider. Document Revised: 12/02/2018 Document Reviewed: 01/04/2018 Elsevier Patient Education  2020 Elsevier Inc.  

## 2020-05-15 NOTE — Progress Notes (Signed)
Ht 5\' 6"  (1.676 m)   Wt 145 lb (65.8 kg)   BMI 23.40 kg/m    Subjective:    Patient ID: Gregory Wood., male    DOB: May 08, 1986, 34 y.o.   MRN: [id number]  HPI: Gregory Wood. is a 34 y.o. male presenting with upper respiratory symptoms.  Chief Complaint  Patient presents with  . Sinusitis    Ongoing appx 3 days. Right side of face.   . Nasal Congestion  . Cough   UPPER RESPIRATORY TRACT INFECTION Onset: Saturday - woke up with right side of face felt "full" Fever: no Cough: yes- started yesterday with green/clear productive cough Shortness of breath: no Wheezing: no Chest pain: no Chest tightness: no Chest congestion: yes Nasal congestion: yes; Saturday was green/orange now is green/clear Runny nose: yes Post nasal drip: yes Sneezing: yes Sore throat: yes  Swollen glands: no Sinus pressure: yes - right side Headache: yes Face pain: yes Toothache: no Ear pain: no  Ear pressure: no  Eyes red/itching:yes Eye drainage/crusting: no  Nausea: no Vomiting: no  Change in appetite: no  Rash: no Fatigue: no Sick contacts: no Strep contacts: no  Context: better  Recurrent sinusitis: no Relief with OTC cold/cough medications: yes  Treatments attempted: allergy medication; tylenol sinus, sleep    No Known Allergies  Outpatient Encounter Medications as of 05/15/2020  Medication Sig Note  . hydrocortisone (CORTEF) 10 MG tablet Take 10 mg by mouth. Take 1.5 in the morning and 1 in the evening. 07/06/2017: Patient currently taking 1 tab in the morning and 1 tab in the evening.  Marland Kitchen guaiFENesin (MUCINEX) 600 MG 12 hr tablet Take 1 tablet (600 mg total) by mouth 2 (two) times daily as needed.   . [DISCONTINUED] cephALEXin (KEFLEX) 500 MG capsule Take 2 capsules (1,000 mg total) by mouth 2 (two) times daily. (Patient not taking: Reported on 05/15/2020)   . [DISCONTINUED] omeprazole (PRILOSEC) 20 MG capsule Take 1 capsule (20 mg total) by mouth daily.   .  [DISCONTINUED] traMADol (ULTRAM) 50 MG tablet Take 1 tablet (50 mg total) by mouth every 6 (six) hours as needed. (Patient not taking: Reported on 05/15/2020)    No facility-administered encounter medications on file as of 05/15/2020.   Patient Active Problem List   Diagnosis Date Noted  . Upper respiratory infection, viral 05/15/2020  . Addison disease ([place])   . Injury of tendon of long head of left biceps 08/26/2016  . Rotator cuff tendinitis, left 07/07/2016  . Strain of left shoulder 07/07/2016   Past Medical History:  Diagnosis Date  . Addison's disease ([place])    Relevant past medical, surgical, family and social history reviewed and updated as indicated. Interim medical history since our last visit reviewed.  Review of Systems  Constitutional: Negative.  Negative for appetite change, chills, diaphoresis, fatigue and fever.  HENT: Positive for congestion, postnasal drip, rhinorrhea, sinus pressure, sinus pain, sneezing and sore throat. Negative for ear pain, facial swelling, hearing loss, trouble swallowing and voice change.   Eyes: Positive for redness and itching. Negative for discharge and visual disturbance.  Respiratory: Positive for cough (productive). Negative for chest tightness, shortness of breath and wheezing.   Cardiovascular: Negative.  Negative for chest pain and palpitations.  Gastrointestinal: Negative.  Negative for nausea and vomiting.  Skin: Negative.  Negative for rash.  Neurological: Positive for headaches. Negative for dizziness, weakness and light-headedness.  Hematological: Negative.  Negative for adenopathy.  Psychiatric/Behavioral: Negative.  Negative  for confusion and sleep disturbance. The patient is not nervous/anxious.     Per HPI unless specifically indicated above     Objective:    Ht 5\' 6"  (1.676 m)   Wt 145 lb (65.8 kg)   BMI 23.40 kg/m   Wt Readings from Last 3 Encounters:  05/15/20 145 lb (65.8 kg)  04/06/20 145 lb (65.8 kg)  08/20/17 158  lb (71.7 kg)    Physical Exam Vitals and nursing note reviewed.  Constitutional:      General: He is not in acute distress.    Appearance: Normal appearance. He is not toxic-appearing.  HENT:     Head: Normocephalic.     Right Ear: External ear normal.     Left Ear: External ear normal.     Nose: Congestion present.     Mouth/Throat:     Mouth: Mucous membranes are moist.     Pharynx: Oropharynx is clear. No oropharyngeal exudate.  Eyes:     General: No scleral icterus.    Extraocular Movements: Extraocular movements intact.  Cardiovascular:     Comments: Unable to assess heart sounds via virtual visit Pulmonary:     Effort: Pulmonary effort is normal. No respiratory distress.     Comments: Unable to assess lung sounds via virtual visit Abdominal:     Comments: Unable to assess bowel sounds due to virtual visit  Skin:    Coloration: Skin is not jaundiced or pale.  Neurological:     General: No focal deficit present.     Mental Status: He is alert and oriented to person, place, and time.  Psychiatric:        Mood and Affect: Mood normal.        Behavior: Behavior normal.        Thought Content: Thought content normal.        Judgment: Judgment normal.     No results found for this or any previous visit.    Assessment & Plan:   Problem List Items Addressed This Visit      Respiratory   Upper respiratory infection, viral - Primary    Acute, ongoing.  Given length of symptoms, and slight improvement, likely viral etiology.  Will continue to treat conservatively with rest, hydration, OTC medications, and start guaifenesin.  Encouraged to either obtain covid-19 testing and/or self-quarantine for 14 days.  If not better by early next week, consider antibiotics.  If symptoms worsen, return to clinic.       Other Visit Diagnoses    Cough       Relevant Medications   guaiFENesin (MUCINEX) 600 MG 12 hr tablet       Follow up plan: Return if symptoms worsen or fail to  improve.   Due to the catastrophic nature of the COVID-19 pandemic, this visit was completed via audio and visual contact via Mychart due to the restrictions of the COVID-19 pandemic. All issues as above were discussed and addressed. Physical exam was done as above through visual confirmation on Mychart. If it was felt that the patient should be evaluated in the office, they were directed there. The patient verbally consented to this visit."} . Location of the patient: home . Location of the provider: work . Those involved with this call:  . Provider: 08/22/17, DNP . CMA: Mardene Celeste, CMA . Front Desk/Registration: [facility]  . Time spent on call: 15 minutes on the phone discussing health concerns. 30 minutes total spent in review of patient's record  and preparation of their chart.  I verified patient identity using two factors (patient name and date of birth). Patient consents verbally to being seen via telemedicine visit today.

## 2020-07-13 DIAGNOSIS — E271 Primary adrenocortical insufficiency: Secondary | ICD-10-CM | POA: Diagnosis not present

## 2020-07-17 DIAGNOSIS — E271 Primary adrenocortical insufficiency: Secondary | ICD-10-CM | POA: Diagnosis not present

## 2020-09-11 DIAGNOSIS — Z03818 Encounter for observation for suspected exposure to other biological agents ruled out: Secondary | ICD-10-CM | POA: Diagnosis not present

## 2020-09-11 DIAGNOSIS — U071 COVID-19: Secondary | ICD-10-CM | POA: Diagnosis not present

## 2020-09-11 DIAGNOSIS — Z20822 Contact with and (suspected) exposure to covid-19: Secondary | ICD-10-CM | POA: Diagnosis not present

## 2020-12-15 ENCOUNTER — Ambulatory Visit: Payer: Self-pay | Attending: Internal Medicine

## 2020-12-15 DIAGNOSIS — Z23 Encounter for immunization: Secondary | ICD-10-CM

## 2020-12-15 NOTE — Progress Notes (Signed)
   [id number]  Name:  Binnie Vonderhaar.    MRN: [id number] DOB: 1986/07/27  12/15/2020  Mr. Yanik was observed post Covid-19 immunization for 15 minutes without incident. He was provided with Vaccine Information Sheet and instruction to access the V-Safe system.   Mr. Sweetser was instructed to call 911 with any severe reactions post vaccine: Marland Kitchen Difficulty breathing  . Swelling of face and throat  . A fast heartbeat  . A bad rash all over body  . Dizziness and weakness   Immunizations Administered    Name Date Dose VIS Date Route   JANSSEN COVID-19 VACCINE 12/15/2020 10:46 AM 0.5 mL 09/26/2020 Intramuscular   Manufacturer: [employer]   Lot: [id number]   NDC: 14782-956-21

## 2021-02-14 DIAGNOSIS — Z20822 Contact with and (suspected) exposure to covid-19: Secondary | ICD-10-CM | POA: Diagnosis not present

## 2021-07-15 DIAGNOSIS — B349 Viral infection, unspecified: Secondary | ICD-10-CM | POA: Diagnosis not present

## 2021-07-15 DIAGNOSIS — Z20822 Contact with and (suspected) exposure to covid-19: Secondary | ICD-10-CM | POA: Diagnosis not present

## 2021-07-15 DIAGNOSIS — J209 Acute bronchitis, unspecified: Secondary | ICD-10-CM | POA: Diagnosis not present

## 2021-09-05 ENCOUNTER — Other Ambulatory Visit: Payer: Self-pay

## 2021-09-05 ENCOUNTER — Ambulatory Visit (INDEPENDENT_AMBULATORY_CARE_PROVIDER_SITE_OTHER): Payer: BC Managed Care – PPO

## 2021-09-05 ENCOUNTER — Ambulatory Visit
Admission: EM | Admit: 2021-09-05 | Discharge: 2021-09-05 | Disposition: A | Discharge status: Home / Self Care | Payer: BC Managed Care – PPO | Attending: Emergency Medicine | Admitting: Emergency Medicine

## 2021-09-05 DIAGNOSIS — R0602 Shortness of breath: Secondary | ICD-10-CM

## 2021-09-05 DIAGNOSIS — R062 Wheezing: Secondary | ICD-10-CM | POA: Diagnosis not present

## 2021-09-05 DIAGNOSIS — R059 Cough, unspecified: Secondary | ICD-10-CM | POA: Diagnosis not present

## 2021-09-05 DIAGNOSIS — J069 Acute upper respiratory infection, unspecified: Secondary | ICD-10-CM

## 2021-09-05 MED ORDER — ALBUTEROL SULFATE HFA 108 (90 BASE) MCG/ACT IN AERS: 2.0000 | INHALATION_SPRAY | RESPIRATORY_TRACT | 0 refills | Status: AC | PRN

## 2021-09-05 MED ORDER — CEFDINIR 300 MG PO CAPS
300.0000 mg | ORAL_CAPSULE | Freq: Two times a day (BID) | ORAL | 0 refills | Status: AC
Start: 2021-09-05 — End: 2021-09-15

## 2021-09-05 MED ORDER — BENZONATATE 100 MG PO CAPS: 200.0000 mg | ORAL_CAPSULE | Freq: Three times a day (TID) | ORAL | 0 refills | Status: AC

## 2021-09-05 MED ORDER — AEROCHAMBER MV MISC
2 refills | Status: AC
Start: 2021-09-05 — End: ?

## 2021-09-05 MED ORDER — PROMETHAZINE-DM 6.25-15 MG/5ML PO SYRP: 5.0000 mL | ORAL_SOLUTION | Freq: Four times a day (QID) | ORAL | 0 refills | Status: DC | PRN

## 2021-09-05 MED ORDER — IPRATROPIUM BROMIDE 0.06 % NA SOLN
2.0000 | Freq: Four times a day (QID) | NASAL | 12 refills | Status: AC
Start: 2021-09-05 — End: ?

## 2021-09-05 NOTE — ED Triage Notes (Signed)
Pt reports having a productive cough and congestion x3 weeks. Sts he was prescribed an inhaler 2 weeks ago, sts symptoms have not changed since using the inhaler and taking OTC meds.

## 2021-09-05 NOTE — Discharge Instructions (Addendum)
The Augmentin twice daily with food for 10 days for treatment of your sinusitis.  Perform sinus irrigation 2-3 times a day with a [employer] sinus rinse kit and distilled water.  Do not use tap water.  You can use plain over-the-counter Mucinex every 6 hours to break up the stickiness of the mucus so your body can clear it.  Increase your oral fluid intake to thin out your mucus so that is also able for your body to clear more easily.  Take an over-the-counter probiotic, such as Culturelle-align-activia, 1 hour after each dose of antibiotic to prevent diarrhea.  Use the Atrovent nasal spray, 2 squirts in each nostril every 6 hours, as needed for runny nose and postnasal drip.  Use the Tessalon Perles every 8 hours during the day.  Take them with a small sip of water.  They may give you some numbness to the base of your tongue or a metallic taste in your mouth, this is normal.  Use the Promethazine DM cough syrup at bedtime for cough and congestion.  It will make you drowsy so do not take it during the day.  Continue to use the albuterol inhaler with a spacer, 2 puffs every 4-6 hours, as needed for shortness of breath and wheezing.  If you develop any new or worsening symptoms return for reevaluation or see your primary care provider.

## 2021-09-05 NOTE — ED Provider Notes (Signed)
[facility]    CSN: [id number] Arrival date & time: 09/05/21  1212      History   Chief Complaint Chief Complaint  Patient presents with   Cough    HPI Gregory Pavek. is a 35 y.o. male.   HPI  35 year old male here for evaluation of respiratory complaints.  Patient reports that he has been experiencing a productive cough with shortness of breath and wheezing for the last 3 weeks.  He was evaluated at the onset of symptoms by his primary care doctor but has not called them to let them know that he is not improved in his symptoms.  He states that he is also experiencing runny nose and nasal congestion, which she has been experiencing for 3 weeks as well, sore throat, and hoarseness to his voice.  He denies any fever, sinus pain, ear pain, or GI complaints.  Past Medical History:  Diagnosis Date   Addison's disease [facility])     Patient Active Problem List   Diagnosis Date Noted   Upper respiratory infection, viral 05/15/2020   Addison disease ([employer])    Injury of tendon of long head of left biceps 08/26/2016   Rotator cuff tendinitis, left 07/07/2016   Strain of left shoulder 07/07/2016    Past Surgical History:  Procedure Laterality Date   EPIGASTRIC HERNIA REPAIR  2012   FRACTURE SURGERY     ulnar   WISDOM TOOTH EXTRACTION         Home Medications    Prior to Admission medications   Medication Sig Start Date End Date Taking? Authorizing Provider  albuterol (VENTOLIN HFA) 108 (90 Base) MCG/ACT inhaler Inhale 2 puffs into the lungs every 4 (four) hours as needed. 09/05/21  Yes Margarette Canada, NP  benzonatate (TESSALON) 100 MG capsule Take 2 capsules (200 mg total) by mouth every 8 (eight) hours. 09/05/21  Yes Margarette Canada, NP  cefdinir (OMNICEF) 300 MG capsule Take 1 capsule (300 mg total) by mouth 2 (two) times daily for 10 days. 09/05/21 09/15/21 Yes Margarette Canada, NP  ipratropium (ATROVENT) 0.06 % nasal spray Place 2 sprays into both nostrils 4 (four)  times daily. 09/05/21  Yes Margarette Canada, NP  promethazine-dextromethorphan (PROMETHAZINE-DM) 6.25-15 MG/5ML syrup Take 5 mLs by mouth 4 (four) times daily as needed. 09/05/21  Yes Margarette Canada, NP  Spacer/Aero-Holding Chambers (AEROCHAMBER MV) inhaler Use as instructed 09/05/21  Yes Margarette Canada, NP  hydrocortisone (CORTEF) 10 MG tablet Take 10 mg by mouth. Take 1.5 in the morning and 1 in the evening. 06/09/17   [provider]    Family History Family History  Problem Relation Age of Onset   Hypertension Mother    Diabetes Father    Stroke Paternal Grandfather    COPD Neg Hx     Social History Social History   Tobacco Use   Smoking status: Never   Smokeless tobacco: Never  Vaping Use   Vaping Use: Never used  Substance Use Topics   Alcohol use: No   Drug use: Yes    Types: Marijuana    Comment: rare     Allergies   Patient has no known allergies.   Review of Systems Review of Systems  Constitutional:  Negative for activity change, appetite change and fever.  HENT:  Positive for congestion, rhinorrhea and sore throat. Negative for ear pain and sinus pain.   Respiratory:  Positive for cough, shortness of breath and wheezing.   Gastrointestinal:  Negative for diarrhea,  nausea and vomiting.  Skin:  Negative for rash.  Hematological: Negative.   Psychiatric/Behavioral: Negative.      Physical Exam Triage Vital Signs ED Triage Vitals  Enc Vitals Group     BP 09/05/21 1227 (!) 131/107     Pulse Rate 09/05/21 1227 73     Resp 09/05/21 1227 18     Temp 09/05/21 1227 98 F (36.7 C)     Temp Source 09/05/21 1227 Oral     SpO2 09/05/21 1227 100 %     Weight 09/05/21 1228 155 lb (70.3 kg)     Height 09/05/21 1228 5' 6"  (1.676 m)     Head Circumference --      Peak Flow --      Pain Score 09/05/21 1227 0     Pain Loc --      Pain Edu? --      Excl. in [place]? --    No data found.  Updated Vital Signs BP (!) 131/107   Pulse 73   Temp 98 F (36.7 C) (Oral)    Resp 18   Ht 5' 6"  (1.676 m)   Wt 155 lb (70.3 kg)   SpO2 100%   BMI 25.02 kg/m   Visual Acuity Right Eye Distance:   Left Eye Distance:   Bilateral Distance:    Right Eye Near:   Left Eye Near:    Bilateral Near:     Physical Exam Vitals and nursing note reviewed.  Constitutional:      General: He is not in acute distress.    Appearance: Normal appearance. He is not ill-appearing.  HENT:     Head: Normocephalic and atraumatic.     Right Ear: Ear canal and external ear normal. There is no impacted cerumen.     Left Ear: Ear canal and external ear normal. There is no impacted cerumen.     Ears:     Comments: Bilateral TMs are perforated at the inferior and posterior aspect.  No erythema or discharge noted.  External auditory canals are clear.    Nose: Congestion and rhinorrhea present.     Comments: This mucosa is erythematous and edematous with clear nasal discharge.    Mouth/Throat:     Mouth: Mucous membranes are moist.     Pharynx: Oropharynx is clear. Posterior oropharyngeal erythema present.  Cardiovascular:     Rate and Rhythm: Normal rate and regular rhythm.     Pulses: Normal pulses.     Heart sounds: Normal heart sounds. No murmur heard.   No gallop.  Pulmonary:     Effort: Pulmonary effort is normal.     Breath sounds: Wheezing and rhonchi present. No rales.  Musculoskeletal:     Cervical back: Normal range of motion and neck supple.  Lymphadenopathy:     Cervical: No cervical adenopathy.  Skin:    General: Skin is warm and dry.     Capillary Refill: Capillary refill takes less than 2 seconds.     Findings: No erythema or rash.  Neurological:     General: No focal deficit present.     Mental Status: He is alert and oriented to person, place, and time.  Psychiatric:        Mood and Affect: Mood normal.        Behavior: Behavior normal.        Thought Content: Thought content normal.        Judgment: Judgment normal.     UC Treatments /  Results   Labs (all labs ordered are listed, but only abnormal results are displayed) Labs Reviewed - No data to display  EKG   Radiology DG Chest 2 View  Result Date: 09/05/2021 CLINICAL DATA:  Three weeks of shortness of breath and wheezing, productive cough, congestion EXAM: CHEST - 2 VIEW COMPARISON:  Chest radiograph 07/06/2017 FINDINGS: The cardiomediastinal silhouette is normal. The lungs clear on the radiograph with good inspiratory effort. There is no focal consolidation or pulmonary edema. There is no pleural effusion or pneumothorax. There is S-shaped scoliosis of the thoracolumbar spine, unchanged. There is no acute osseous abnormality. IMPRESSION: No radiographic evidence of acute cardiopulmonary process. Electronically Signed   By: Valetta Mole M.D.   On: 09/05/2021 13:32    Procedures Procedures (including critical care time)  Medications Ordered in UC Medications - No data to display  Initial Impression / Assessment and Plan / UC Course  I have reviewed the triage vital signs and the nursing notes.  Pertinent labs & imaging results that were available during my care of the patient were reviewed by me and considered in my medical decision making (see chart for details).  Patient is a nontoxic-appearing 35 year old male here for evaluation of continuing respiratory complaints that have been present for the past 3 weeks.  Patient reports that he was evaluated by his PCP 2 weeks ago was prescribed an inhaler.  He reports that there has been no change to his symptomology since using the inhaler and he is also taking over-the-counter medication.  Patient reports that his nasal discharge is clear but his sputum production from his chest is mixed clear, green, and yellow.  This is associated with shortness of breath and wheezing, hoarseness, and a sore throat.  He has not had a fever, sinus pain, ear pain, or GI complaints.  Patient's physical exam reveals perforated tympanic membranes  bilaterally.  Patient reports that this is something he was told the last time he had a physical exam.  He is on experiencing ear pain.  The surrounding tissue is not erythematous and there is no drainage through the perforation.  External auditory canals are clear.  Nasal mucosa is mildly erythematous edematous with scant clear nasal discharge.  Patient has no tenderness to percussion of frontal or maxillary sinuses.  Oropharyngeal exam is benign.  No cervical lymphadenopathy appreciated exam.  Cardiopulmonary exam reveals the presence of wheezes and rhonchi in all lung fields.  Will obtain chest x-ray to evaluate for acute cardiopulmonary process.  Chest x-ray independently reviewed and evaluated by me.  Patient is x-rays are well-pneumatized.  Pulmonary vasculature is prominent.  No overt infiltrate or effusion noted.  Costophrenic angles are crisp.  Patient does have some mild scoliosis.  Radiology overread is pending. Radiology impression is negative for acute cardiopulmonary process.  Due to patient's protracted duration of symptoms we will do a trial of antibiotics.  We will also refill his inhaler and has not given Tessalon Perles and Promethazine DM cough syrup for cough control.   Final Clinical Impressions(s) / UC Diagnoses   Final diagnoses:  Upper respiratory tract infection, unspecified type  Cough     Discharge Instructions      The Augmentin twice daily with food for 10 days for treatment of your sinusitis.  Perform sinus irrigation 2-3 times a day with a [employer] sinus rinse kit and distilled water.  Do not use tap water.  You can use plain over-the-counter Mucinex every 6 hours to break up the  stickiness of the mucus so your body can clear it.  Increase your oral fluid intake to thin out your mucus so that is also able for your body to clear more easily.  Take an over-the-counter probiotic, such as Culturelle-align-activia, 1 hour after each dose of antibiotic to prevent  diarrhea.  Use the Atrovent nasal spray, 2 squirts in each nostril every 6 hours, as needed for runny nose and postnasal drip.  Use the Tessalon Perles every 8 hours during the day.  Take them with a small sip of water.  They may give you some numbness to the base of your tongue or a metallic taste in your mouth, this is normal.  Use the Promethazine DM cough syrup at bedtime for cough and congestion.  It will make you drowsy so do not take it during the day.  Continue to use the albuterol inhaler with a spacer, 2 puffs every 4-6 hours, as needed for shortness of breath and wheezing.  If you develop any new or worsening symptoms return for reevaluation or see your primary care provider.      ED Prescriptions     Medication Sig Dispense Auth. Provider   cefdinir (OMNICEF) 300 MG capsule Take 1 capsule (300 mg total) by mouth 2 (two) times daily for 10 days. 20 capsule Margarette Canada, NP   albuterol (VENTOLIN HFA) 108 (90 Base) MCG/ACT inhaler Inhale 2 puffs into the lungs every 4 (four) hours as needed. 18 g Margarette Canada, NP   Spacer/Aero-Holding Chambers (AEROCHAMBER MV) inhaler Use as instructed 1 each Margarette Canada, NP   benzonatate (TESSALON) 100 MG capsule Take 2 capsules (200 mg total) by mouth every 8 (eight) hours. 21 capsule Margarette Canada, NP   ipratropium (ATROVENT) 0.06 % nasal spray Place 2 sprays into both nostrils 4 (four) times daily. 15 mL Margarette Canada, NP   promethazine-dextromethorphan (PROMETHAZINE-DM) 6.25-15 MG/5ML syrup Take 5 mLs by mouth 4 (four) times daily as needed. 118 mL Margarette Canada, NP      PDMP not reviewed this encounter.   Margarette Canada, NP 09/05/21 1345

## 2021-09-06 ENCOUNTER — Ambulatory Visit: Payer: BC Managed Care – PPO | Admitting: Family Medicine

## 2021-10-11 DIAGNOSIS — E271 Primary adrenocortical insufficiency: Secondary | ICD-10-CM | POA: Diagnosis not present

## 2021-10-16 DIAGNOSIS — E271 Primary adrenocortical insufficiency: Secondary | ICD-10-CM | POA: Diagnosis not present

## 2022-04-08 IMAGING — CR DG CHEST 2V
3 series · 3 of 3 positions shown · non-contrast
Comparison: Chest radiograph 07/06/2017

CLINICAL DATA: Three weeks of shortness of breath and wheezing,
productive cough, congestion

EXAM:
CHEST - 2 VIEW

[chest pa (1 of 2)]
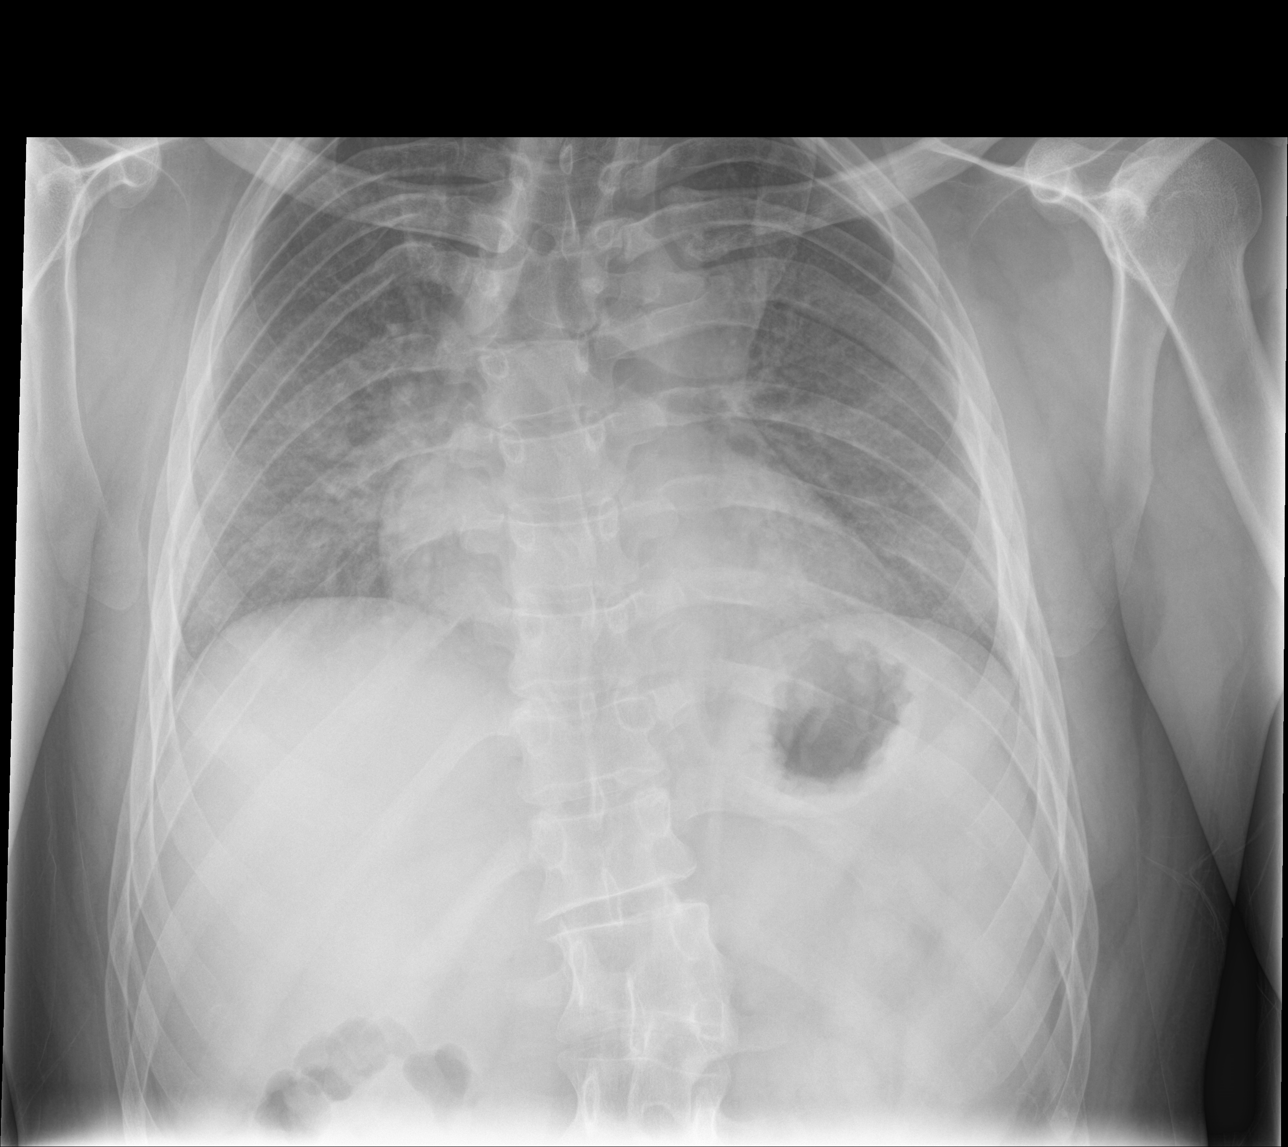

[chest lat]
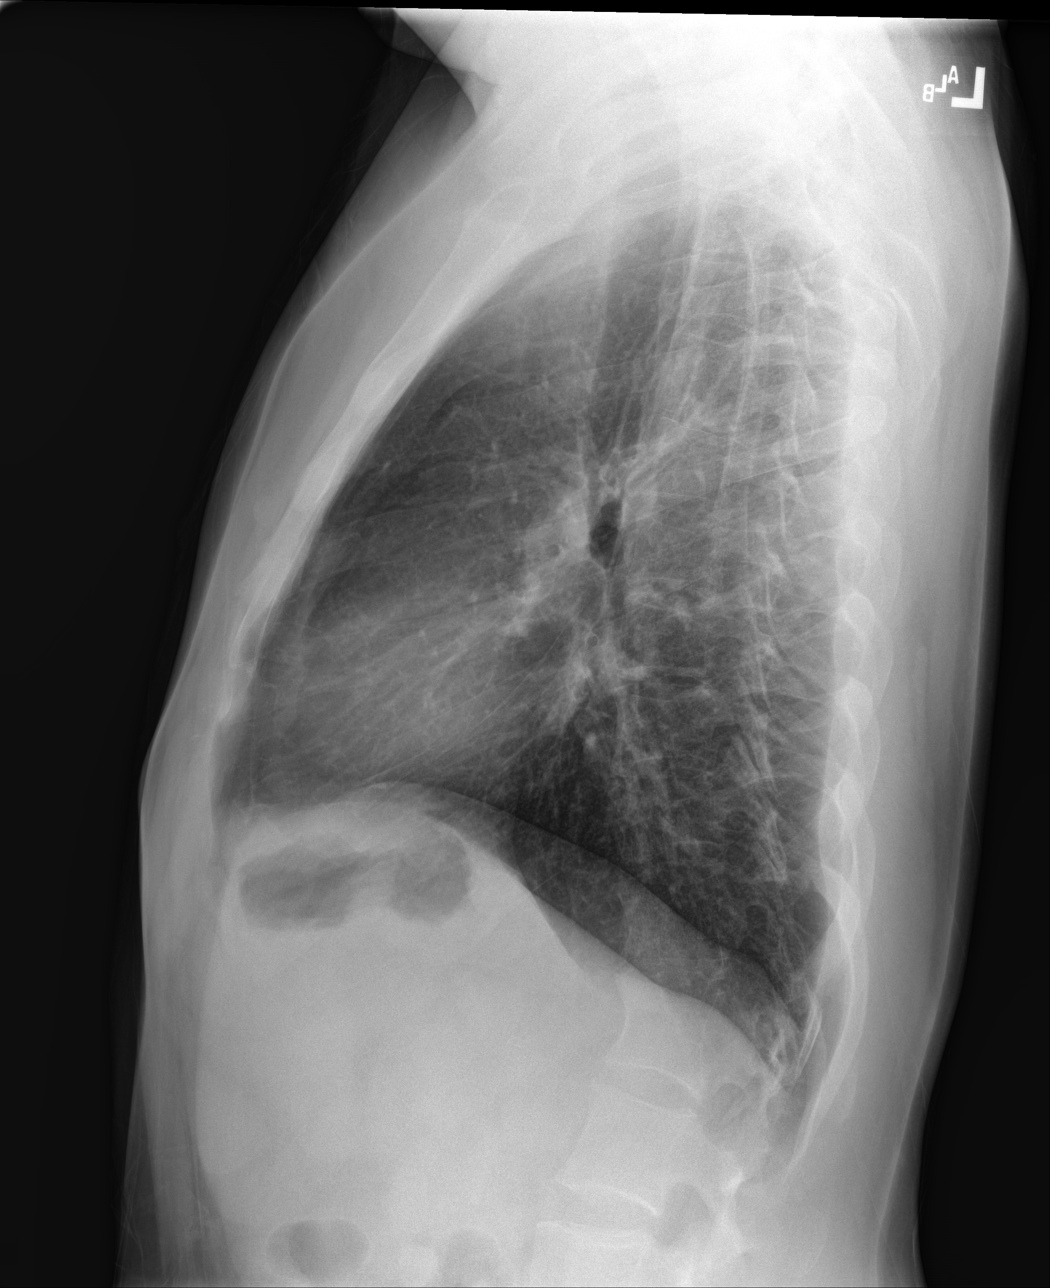

[chest pa (2 of 2)]
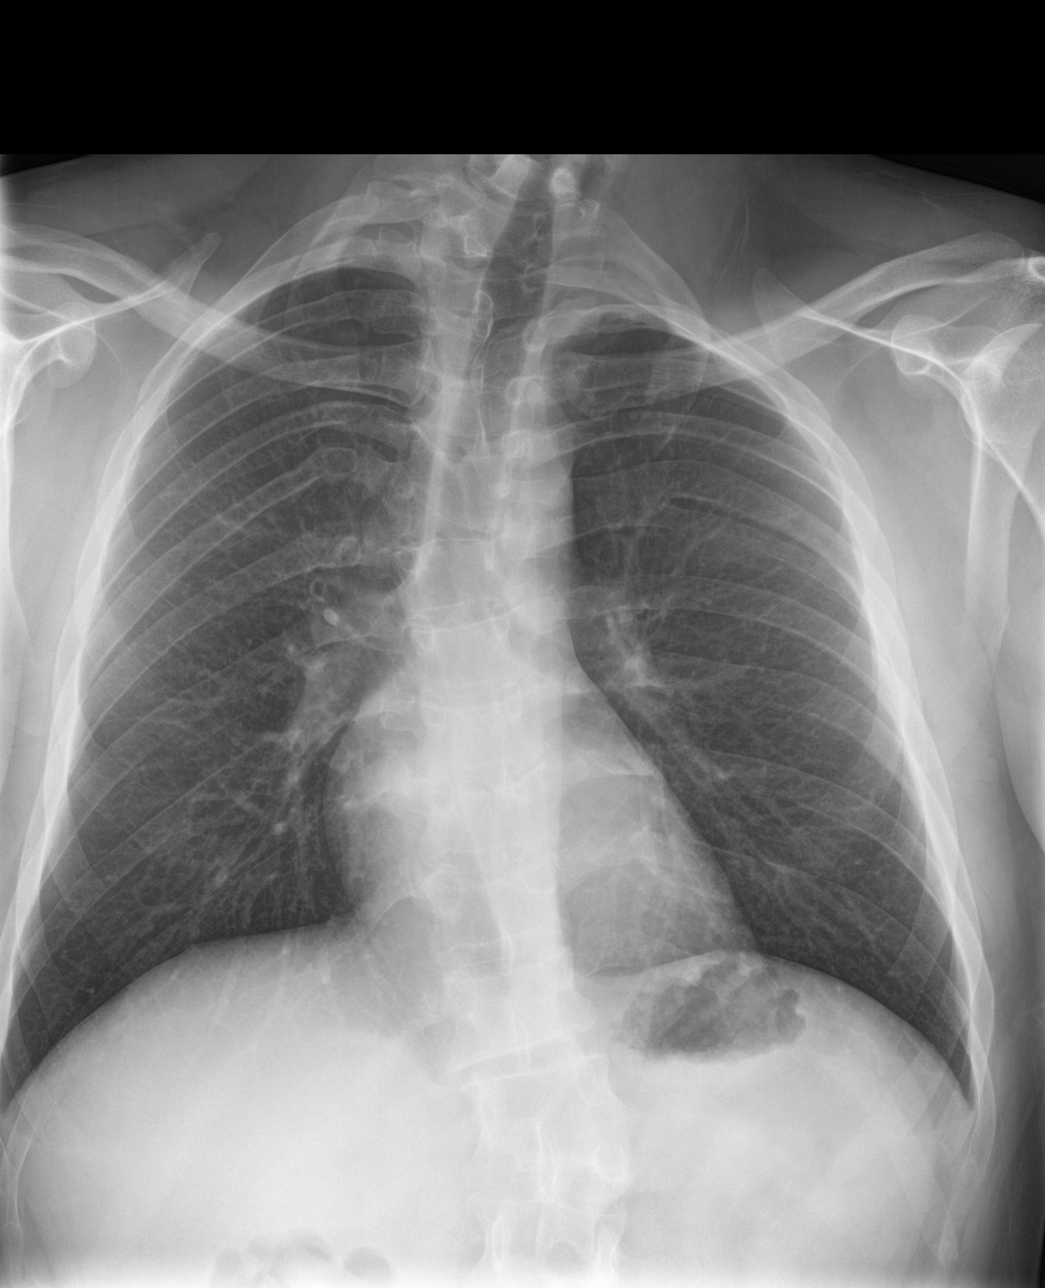

[3 of 3 positions shown; findings below may reference images not displayed]

FINDINGS: The cardiomediastinal silhouette is normal.

The lungs clear on the radiograph with good inspiratory effort.
There is no focal consolidation or pulmonary edema. There is no
pleural effusion or pneumothorax.

There is S-shaped scoliosis of the thoracolumbar spine, unchanged.
There is no acute osseous abnormality.
IMPRESSION: No radiographic evidence of acute cardiopulmonary process.

## 2022-10-08 DIAGNOSIS — E271 Primary adrenocortical insufficiency: Secondary | ICD-10-CM | POA: Diagnosis not present

## 2024-11-09 ENCOUNTER — Ambulatory Visit
Admission: EM | Admit: 2024-11-09 | Discharge: 2024-11-09 | Disposition: A | Discharge status: Home / Self Care | Attending: Emergency Medicine | Admitting: Emergency Medicine

## 2024-11-09 DIAGNOSIS — J111 Influenza due to unidentified influenza virus with other respiratory manifestations: Secondary | ICD-10-CM

## 2024-11-09 LAB — POC COVID19/FLU A&B COMBO
Covid Antigen, POC: NEGATIVE
Influenza A Antigen, POC: NEGATIVE
Influenza B Antigen, POC: NEGATIVE

## 2024-11-09 MED ORDER — ACETAMINOPHEN 325 MG PO TABS
975.0000 mg | ORAL_TABLET | Freq: Once | ORAL | Status: AC
  Administered 2024-11-09: 975 mg via ORAL

## 2024-11-09 MED ORDER — PROMETHAZINE-DM 6.25-15 MG/5ML PO SYRP: 5.0000 mL | ORAL_SOLUTION | Freq: Four times a day (QID) | ORAL | 0 refills | Status: AC | PRN

## 2024-11-09 NOTE — ED Provider Notes (Signed)
 [facility]    CSN: [id number] Arrival date & time: 11/09/24  9062      History   Chief Complaint Chief Complaint  Patient presents with   Fever   Chills   Generalized Body Aches    HPI Gregory Scholz. is a 38 y.o. male.   38 year old male patient, Gregory Wood, Gregory Wood., presents to urgent care for evaluation of fever, chills, loss of appetite, body aches for 2 days.  Patient states he works at [employer], was also recently around his brother-in-law who he believes he contracted symptoms over the holidays no over-the-counter meds today.  Took Tylenol  PM last night.  Patient is taking p.o.'s without difficulty.  The history is provided by the patient. No language interpreter was used.    Past Medical History:  Diagnosis Date   Addison's disease [facility])     Patient Active Problem List   Diagnosis Date Noted   Influenza-like illness 11/09/2024   Upper respiratory infection, viral 05/15/2020   Addison disease ([facility])    Injury of tendon of long head of left biceps 08/26/2016   Rotator cuff tendinitis, left 07/07/2016   Strain of left shoulder 07/07/2016    Past Surgical History:  Procedure Laterality Date   EPIGASTRIC HERNIA REPAIR  2012   FRACTURE SURGERY     ulnar   WISDOM TOOTH EXTRACTION         Home Medications    Prior to Admission medications   Medication Sig Start Date End Date Taking? Authorizing Provider  albuterol  (VENTOLIN  HFA) 108 (90 Base) MCG/ACT inhaler Inhale 2 puffs into the lungs every 4 (four) hours as needed. 09/05/21   Bernardino Ditch, NP  benzonatate  (TESSALON ) 100 MG capsule Take 2 capsules (200 mg total) by mouth every 8 (eight) hours. 09/05/21   Bernardino Ditch, NP  hydrocortisone  (CORTEF ) 10 MG tablet Take 10 mg by mouth. Take 1.5 in the morning and 1 in the evening. 06/09/17   [provider]  ipratropium (ATROVENT ) 0.06 % nasal spray Place 2 sprays into both nostrils 4 (four) times daily. 09/05/21   Bernardino Ditch, NP  promethazine -dextromethorphan  (PROMETHAZINE -DM) 6.25-15 MG/5ML syrup Take 5 mLs by mouth 4 (four) times daily as needed. 11/09/24   Verl Whitmore, Rilla, NP  Spacer/Aero-Holding [place] (AEROCHAMBER MV) inhaler Use as instructed 09/05/21   Bernardino Ditch, NP    Family History Family History  Problem Relation Age of Onset   Hypertension Mother    Diabetes Father    Stroke Paternal Grandfather    COPD Neg Hx     Social History Social History   Tobacco Use   Smoking status: Never   Smokeless tobacco: Never  Vaping Use   Vaping status: Never Used  Substance Use Topics   Alcohol use: No   Drug use: Yes    Types: Marijuana    Comment: rare     Allergies   Patient has no known allergies.   Review of Systems Review of Systems  Constitutional:  Positive for appetite change, chills and fever.  Respiratory:  Positive for cough. Negative for wheezing.   Musculoskeletal:  Positive for myalgias.  All other systems reviewed and are negative.    Physical Exam Triage Vital Signs ED Triage Vitals  Encounter Vitals Group     BP 11/09/24 1024 (!) 126/104     Girls Systolic BP Percentile --      Girls Diastolic BP Percentile --      Boys Systolic BP Percentile --  Boys Diastolic BP Percentile --      Pulse Rate 11/09/24 1024 (!) 115     Resp 11/09/24 1024 18     Temp 11/09/24 1024 (!) 102.7 F (39.3 C)     Temp Source 11/09/24 1024 Oral     SpO2 11/09/24 1024 98 %     Weight 11/09/24 1022 148 lb (67.1 kg)     Height --      Head Circumference --      Peak Flow --      Pain Score 11/09/24 1023 5     Pain Loc --      Pain Education --      Exclude from Growth Chart --    No data found.  Updated Vital Signs BP (!) 126/104 (BP Location: Left Arm)   Pulse (!) 115   Temp (!) 102.7 F (39.3 C) (Oral)   Resp 18   Wt 148 lb (67.1 kg)   SpO2 98%   BMI 23.89 kg/m   Visual Acuity Right Eye Distance:   Left Eye Distance:   Bilateral Distance:    Right Eye  Near:   Left Eye Near:    Bilateral Near:     Physical Exam Vitals and nursing note reviewed.  Constitutional:      General: He is not in acute distress.    Appearance: He is well-developed. He is not ill-appearing or toxic-appearing.  HENT:     Head: Normocephalic.     Right Ear: Tympanic membrane is retracted.     Left Ear: Tympanic membrane is retracted.     Nose: Mucosal edema and congestion present.     Mouth/Throat:     Mouth: Mucous membranes are moist.     Pharynx: Uvula midline.  Eyes:     General: Lids are normal.     Conjunctiva/sclera: Conjunctivae normal.     Pupils: Pupils are equal, round, and reactive to light.  Cardiovascular:     Rate and Rhythm: Regular rhythm. Tachycardia present.     Heart sounds: Normal heart sounds.     Comments: HR 115, T 102.7 Pulmonary:     Effort: Pulmonary effort is normal. No respiratory distress.     Breath sounds: Normal breath sounds and air entry. No decreased breath sounds or wheezing.     Comments: R 18,unlabored,sat 98% on RA Abdominal:     General: There is no distension.     Palpations: Abdomen is soft.  Musculoskeletal:        General: Normal range of motion.     Cervical back: Normal range of motion.  Skin:    General: Skin is warm and dry.     Findings: No rash.  Neurological:     General: No focal deficit present.     Mental Status: He is alert and oriented to person, place, and time.     GCS: GCS eye subscore is 4. GCS verbal subscore is 5. GCS motor subscore is 6.     Cranial Nerves: No cranial nerve deficit.     Sensory: No sensory deficit.  Psychiatric:        Attention and Perception: Attention normal.        Mood and Affect: Mood normal.        Speech: Speech normal.        Behavior: Behavior normal. Behavior is cooperative.      UC Treatments / Results  Labs (all labs ordered are listed, but only abnormal results are  displayed) Labs Reviewed  POC COVID19/FLU A&B COMBO - Normal     EKG   Radiology No results found.  Procedures Procedures (including critical care time)  Medications Ordered in UC Medications  acetaminophen (TYLENOL) tablet 975 mg (975 mg Oral Given 11/09/24 1055)    Initial Impression / Assessment and Plan / UC Course  I have reviewed the triage vital signs and the nursing notes.  Pertinent labs & imaging results that were available during my care of the patient were reviewed by me and considered in my medical decision making (see chart for details).  Clinical Course as of 11/09/24 1122  Wed Nov 09, 2024  1100 Flu and COVID are negative [JD]    Clinical Course User Index [JD] Sonnet Rizor, Rilla, NP   Discussed exam findings and plan of care with patient, patient is taking p.o.'s without difficulty, has not taken any meds this morning, patient received Tylenol 975 mg in office for fever of 102.7.  COVID and flu are negative, Phenergan  DM scripted for cough ,strict ER precautions given, patient verbalized understanding to this provider.  Ddx: Influenza- like illness, viral illness Final Clinical Impressions(s) / UC Diagnoses   Final diagnoses:  Influenza-like illness     Discharge Instructions      Most likely you have a viral illness: no antibiotic is indicated at this time, May treat with OTC meds of choice. Make sure to drink plenty of fluids to stay hydrated(gatorade, pedialyte, hydration packs, water, popsicles,jello,etc), avoid caffeine products. Follow up with PCP.   Take phenergan  DM for cough as prescribed, may cause drowsiness.     ED Prescriptions     Medication Sig Dispense Auth. Provider   promethazine -dextromethorphan (PROMETHAZINE -DM) 6.25-15 MG/5ML syrup Take 5 mLs by mouth 4 (four) times daily as needed. 118 mL Zebulin Siegel, NP      PDMP not reviewed this encounter.   Aminta Rilla, NP 11/09/24 1122

## 2024-11-09 NOTE — ED Triage Notes (Signed)
 Pt c/o fever,chills,loss of appetite & bodyaches x2 days. No OTC meds w/o relief.

## 2024-11-09 NOTE — Discharge Instructions (Addendum)
 Most likely you have a viral illness: no antibiotic is indicated at this time, May treat with OTC meds of choice. Make sure to drink plenty of fluids to stay hydrated(gatorade, pedialyte, hydration packs, water, popsicles,jello,etc), avoid caffeine products. Follow up with PCP.   Take phenergan  DM for cough as prescribed, may cause drowsiness.

## 2024-11-10 ENCOUNTER — Other Ambulatory Visit: Payer: Self-pay

## 2024-11-10 ENCOUNTER — Emergency Department

## 2024-11-10 ENCOUNTER — Ambulatory Visit: Payer: Self-pay

## 2024-11-10 ENCOUNTER — Inpatient Hospital Stay
Admission: EM | Admit: 2024-11-10 | Discharge: 2024-11-12 | DRG: 871 | Disposition: A | Attending: Obstetrics and Gynecology | Admitting: Obstetrics and Gynecology

## 2024-11-10 DIAGNOSIS — A481 Legionnaires' disease: Secondary | ICD-10-CM

## 2024-11-10 DIAGNOSIS — A419 Sepsis, unspecified organism: Secondary | ICD-10-CM | POA: Insufficient documentation

## 2024-11-10 DIAGNOSIS — J189 Pneumonia, unspecified organism: Secondary | ICD-10-CM | POA: Diagnosis not present

## 2024-11-10 DIAGNOSIS — E271 Primary adrenocortical insufficiency: Secondary | ICD-10-CM | POA: Diagnosis present

## 2024-11-10 DIAGNOSIS — E871 Hypo-osmolality and hyponatremia: Secondary | ICD-10-CM | POA: Diagnosis present

## 2024-11-10 LAB — CBC WITH DIFFERENTIAL/PLATELET
Abs Immature Granulocytes: 0.28 K/uL — ABNORMAL HIGH (ref 0.00–0.07)
Basophils Absolute: 0.2 K/uL — ABNORMAL HIGH (ref 0.0–0.1)
Basophils Relative: 1 %
Eosinophils Absolute: 0 K/uL (ref 0.0–0.5)
Eosinophils Relative: 0 %
HCT: 43.7 % (ref 39.0–52.0)
Hemoglobin: 15.4 g/dL (ref 13.0–17.0)
Immature Granulocytes: 2 %
Lymphocytes Relative: 4 %
Lymphs Abs: 0.7 K/uL (ref 0.7–4.0)
MCH: 29.2 pg (ref 26.0–34.0)
MCHC: 35.2 g/dL (ref 30.0–36.0)
MCV: 82.8 fL (ref 80.0–100.0)
Monocytes Absolute: 1.1 K/uL — ABNORMAL HIGH (ref 0.1–1.0)
Monocytes Relative: 6 %
Neutro Abs: 15.8 K/uL — ABNORMAL HIGH (ref 1.7–7.7)
Neutrophils Relative %: 87 %
Platelets: 242 K/uL (ref 150–400)
RBC: 5.28 MIL/uL (ref 4.22–5.81)
RDW: 11.7 % (ref 11.5–15.5)
WBC: 18.1 K/uL — ABNORMAL HIGH (ref 4.0–10.5)
nRBC: 0 % (ref 0.0–0.2)

## 2024-11-10 LAB — HIV ANTIBODY (ROUTINE TESTING W REFLEX): HIV Screen 4th Generation wRfx: NONREACTIVE

## 2024-11-10 LAB — PROCALCITONIN: Procalcitonin: 18.7 ng/mL

## 2024-11-10 LAB — URINALYSIS, W/ REFLEX TO CULTURE (INFECTION SUSPECTED)
Bilirubin Urine: NEGATIVE
Glucose, UA: NEGATIVE mg/dL
Ketones, ur: 5 mg/dL — AB
Leukocytes,Ua: NEGATIVE
Nitrite: NEGATIVE
Protein, ur: 100 mg/dL — AB
Specific Gravity, Urine: 1.005 (ref 1.005–1.030)
pH: 5 (ref 5.0–8.0)

## 2024-11-10 LAB — BASIC METABOLIC PANEL WITH GFR
Anion gap: 16 — ABNORMAL HIGH (ref 5–15)
Anion gap: 15 (ref 5–15)
Anion gap: 15 (ref 5–15)
BUN: 14 mg/dL (ref 6–20)
BUN: 14 mg/dL (ref 6–20)
BUN: 14 mg/dL (ref 6–20)
CO2: 20 mmol/L — ABNORMAL LOW (ref 22–32)
CO2: 19 mmol/L — ABNORMAL LOW (ref 22–32)
CO2: 22 mmol/L (ref 22–32)
Calcium: 9 mg/dL (ref 8.9–10.3)
Calcium: 8.3 mg/dL — ABNORMAL LOW (ref 8.9–10.3)
Calcium: 9.1 mg/dL (ref 8.9–10.3)
Chloride: 82 mmol/L — ABNORMAL LOW (ref 98–111)
Chloride: 89 mmol/L — ABNORMAL LOW (ref 98–111)
Chloride: 90 mmol/L — ABNORMAL LOW (ref 98–111)
Creatinine, Ser: 1.06 mg/dL (ref 0.61–1.24)
Creatinine, Ser: 1.03 mg/dL (ref 0.61–1.24)
Creatinine, Ser: 1.19 mg/dL (ref 0.61–1.24)
GFR, Estimated: >60 mL/min (ref >60)
GFR, Estimated: >60 mL/min (ref >60)
GFR, Estimated: >60 mL/min (ref >60)
Glucose, Bld: 129 mg/dL — ABNORMAL HIGH (ref 70–99)
Glucose, Bld: 133 mg/dL — ABNORMAL HIGH (ref 70–99)
Glucose, Bld: 111 mg/dL — ABNORMAL HIGH (ref 70–99)
Potassium: 4.4 mmol/L (ref 3.5–5.1)
Potassium: 4.1 mmol/L (ref 3.5–5.1)
Potassium: 4.5 mmol/L (ref 3.5–5.1)
Sodium: 118 mmol/L — CL (ref 135–145)
Sodium: 123 mmol/L — ABNORMAL LOW (ref 135–145)
Sodium: 127 mmol/L — ABNORMAL LOW (ref 135–145)

## 2024-11-10 LAB — LACTIC ACID, PLASMA
Lactic Acid, Venous: 1.2 mmol/L (ref 0.5–1.9)
Lactic Acid, Venous: 1.1 mmol/L (ref 0.5–1.9)

## 2024-11-10 LAB — PROTIME-INR
INR: 1.3 — ABNORMAL HIGH (ref 0.8–1.2)
Prothrombin Time: 16.8 s — ABNORMAL HIGH (ref 11.4–15.2)

## 2024-11-10 LAB — HEPATIC FUNCTION PANEL
ALT: 28 U/L (ref 0–44)
AST: 33 U/L (ref 15–41)
Albumin: 4.2 g/dL (ref 3.5–5.0)
Alkaline Phosphatase: 122 U/L (ref 38–126)
Bilirubin, Direct: 1 mg/dL — ABNORMAL HIGH (ref 0.0–0.2)
Indirect Bilirubin: 1 mg/dL — ABNORMAL HIGH (ref 0.3–0.9)
Total Bilirubin: 2 mg/dL — ABNORMAL HIGH (ref 0.0–1.2)
Total Protein: 8 g/dL (ref 6.5–8.1)

## 2024-11-10 LAB — STREP PNEUMONIAE URINARY ANTIGEN: Strep Pneumo Urinary Antigen: NEGATIVE

## 2024-11-10 LAB — MRSA NEXT GEN BY PCR, NASAL: MRSA by PCR Next Gen: NOT DETECTED

## 2024-11-10 MED ORDER — LACTATED RINGERS IV SOLN: INTRAVENOUS | Status: DC

## 2024-11-10 MED ORDER — SODIUM CHLORIDE 0.9 % IV SOLN
2.0000 g | Freq: Once | INTRAVENOUS | Status: AC
  Administered 2024-11-10: 2 g via INTRAVENOUS
  Filled 2024-11-10: qty 20

## 2024-11-10 MED ORDER — SODIUM CHLORIDE 0.9 % IV BOLUS
1000.0000 mL | Freq: Once | INTRAVENOUS | Status: AC
  Administered 2024-11-10: 1000 mL via INTRAVENOUS

## 2024-11-10 MED ORDER — HYDROCORTISONE 10 MG PO TABS
10.0000 mg | ORAL_TABLET | Freq: Every day | ORAL | Status: DC
  Administered 2024-11-10: 10 mg via ORAL
  Filled 2024-11-10: qty 1

## 2024-11-10 MED ORDER — ENOXAPARIN SODIUM 40 MG/0.4ML IJ SOSY
40.0000 mg | PREFILLED_SYRINGE | INTRAMUSCULAR | Status: DC
  Filled 2024-11-10: qty 0.4

## 2024-11-10 MED ORDER — SODIUM CHLORIDE 0.9 % IV SOLN
500.0000 mg | Freq: Once | INTRAVENOUS | Status: AC
  Administered 2024-11-10: 500 mg via INTRAVENOUS
  Filled 2024-11-10: qty 5

## 2024-11-10 MED ORDER — ACETAMINOPHEN 650 MG RE SUPP: 650.0000 mg | Freq: Four times a day (QID) | RECTAL | Status: DC | PRN

## 2024-11-10 MED ORDER — POLYETHYLENE GLYCOL 3350 17 G PO PACK: 17.0000 g | PACK | Freq: Every day | ORAL | Status: DC | PRN

## 2024-11-10 MED ORDER — ENOXAPARIN SODIUM 40 MG/0.4ML IJ SOSY
40.0000 mg | PREFILLED_SYRINGE | INTRAMUSCULAR | Status: DC
  Administered 2024-11-11 – 2024-11-12 (×2): 40 mg via SUBCUTANEOUS
  Filled 2024-11-10 (×2): qty 0.4

## 2024-11-10 MED ORDER — IBUPROFEN 800 MG PO TABS
800.0000 mg | ORAL_TABLET | Freq: Once | ORAL | Status: AC
  Administered 2024-11-10: 800 mg via ORAL
  Filled 2024-11-10: qty 1

## 2024-11-10 MED ORDER — ACETAMINOPHEN 500 MG PO TABS
1000.0000 mg | ORAL_TABLET | Freq: Once | ORAL | Status: AC
  Administered 2024-11-10: 1000 mg via ORAL
  Filled 2024-11-10: qty 2

## 2024-11-10 MED ORDER — ACETAMINOPHEN 325 MG PO TABS
650.0000 mg | ORAL_TABLET | Freq: Four times a day (QID) | ORAL | Status: DC | PRN
  Administered 2024-11-10 – 2024-11-11 (×5): 650 mg via ORAL
  Filled 2024-11-10 (×5): qty 2

## 2024-11-10 MED ORDER — HYDROCORTISONE SOD SUC (PF) 100 MG IJ SOLR
100.0000 mg | Freq: Once | INTRAMUSCULAR | Status: AC
  Administered 2024-11-10: 100 mg via INTRAVENOUS
  Filled 2024-11-10 (×2): qty 2

## 2024-11-10 MED ORDER — SODIUM CHLORIDE 0.9 % IV SOLN
2.0000 g | INTRAVENOUS | Status: DC
  Administered 2024-11-11 – 2024-11-12 (×2): 2 g via INTRAVENOUS
  Filled 2024-11-10 (×2): qty 20

## 2024-11-10 MED ORDER — OXYCODONE HCL 5 MG PO TABS: 5.0000 mg | ORAL_TABLET | ORAL | Status: DC | PRN

## 2024-11-10 MED ORDER — PANTOPRAZOLE SODIUM 40 MG IV SOLR
40.0000 mg | Freq: Two times a day (BID) | INTRAVENOUS | Status: DC
  Administered 2024-11-10 – 2024-11-11 (×3): 40 mg via INTRAVENOUS
  Filled 2024-11-10 (×3): qty 10

## 2024-11-10 MED ORDER — IPRATROPIUM-ALBUTEROL 0.5-2.5 (3) MG/3ML IN SOLN: 3.0000 mL | Freq: Four times a day (QID) | RESPIRATORY_TRACT | Status: DC | PRN

## 2024-11-10 MED ORDER — HYDROCORTISONE 10 MG PO TABS
15.0000 mg | ORAL_TABLET | Freq: Every day | ORAL | Status: DC
  Administered 2024-11-10 – 2024-11-11 (×2): 15 mg via ORAL
  Filled 2024-11-10 (×2): qty 1.5

## 2024-11-10 MED ORDER — ONDANSETRON HCL 4 MG PO TABS
4.0000 mg | ORAL_TABLET | Freq: Four times a day (QID) | ORAL | Status: DC | PRN
  Filled 2024-11-10: qty 1

## 2024-11-10 MED ORDER — SODIUM CHLORIDE 0.9 % IV SOLN
500.0000 mg | INTRAVENOUS | Status: DC
  Administered 2024-11-11: 500 mg via INTRAVENOUS
  Filled 2024-11-10: qty 5

## 2024-11-10 MED ORDER — LACTATED RINGERS IV BOLUS (SEPSIS): 1000.0000 mL | Freq: Once | INTRAVENOUS | Status: DC

## 2024-11-10 MED ORDER — GUAIFENESIN-DM 100-10 MG/5ML PO SYRP
5.0000 mL | ORAL_SOLUTION | ORAL | Status: DC | PRN
  Administered 2024-11-10 – 2024-11-11 (×3): 5 mL via ORAL
  Filled 2024-11-10 (×3): qty 10

## 2024-11-10 MED ORDER — ONDANSETRON HCL 4 MG/2ML IJ SOLN
4.0000 mg | Freq: Four times a day (QID) | INTRAMUSCULAR | Status: DC | PRN
  Administered 2024-11-10: 4 mg via INTRAVENOUS
  Filled 2024-11-10: qty 2

## 2024-11-10 NOTE — Sepsis Progress Note (Signed)
 Elink will follow per sepsis protocol.

## 2024-11-10 NOTE — ED Provider Notes (Signed)
 APP supervisory note  38 year old male with history of Addison's disease who presented to the emergency department with cough, congestion, fever.  X-Phelicia Dantes here concerning for right lower lobe pneumonia.  Tachycardic and febrile on presentation.  Labs with leukocytosis with WBC of 18.1.  Also notable for significant hyponatremia with sodium of 118, new for patient.  Has history of Addison's disease but fortunately no hyperkalemia or hypotension.  Patient reports he took an increased dose of steroids for 1 day a few days ago.  He was evaluated at bedside.  Satting appropriately on room air.  Lung sounds coarse most notably in the right base.  He is agreeable with plan for admission.  APC to discuss with hospitalist team.   Levander Slate, MD 11/10/24 1239

## 2024-11-10 NOTE — Progress Notes (Signed)
 CODE SEPSIS - PHARMACY COMMUNICATION  **Broad Spectrum Antibiotics should be administered within 1 hour of Sepsis diagnosis**  Time Code Sepsis Called/Page Received: 9146  Antibiotics Ordered: Ceftriaxone & Z-max  Time of 1st antibiotic administration: 0912  Additional action taken by pharmacy: N/A   Gregory Wood 11/10/2024  9:34 AM

## 2024-11-10 NOTE — ED Provider Notes (Signed)
 [facility] Provider Note    Event Date/Time   First MD Initiated Contact with Patient 11/10/24 [phone]     (approximate)   History   Fever   HPI  Gregory Wood. is a 38 y.o. male with PMH of Addison's disease who presents for evaluation of flu like symptoms.  He has had a cough, congestion, fever and began having diarrhea last night.  Patient was seen at urgent care yesterday and tested negative for flu and COVID.  He has been taking over-the-counter cold medicine to manage his symptoms.  Patient presents today due to worsening symptoms.      Physical Exam   Triage Vital Signs: ED Triage Vitals  Encounter Vitals Group     BP 11/10/24 0735 (!) 123/102     Girls Systolic BP Percentile --      Girls Diastolic BP Percentile --      Boys Systolic BP Percentile --      Boys Diastolic BP Percentile --      Pulse Rate 11/10/24 0735 (!) 115     Resp 11/10/24 0735 20     Temp 11/10/24 0735 (!) 101.9 F (38.8 C)     Temp Source 11/10/24 0735 Oral     SpO2 11/10/24 0735 98 %     Weight 11/10/24 0738 148 lb (67.1 kg)     Height --      Head Circumference --      Peak Flow --      Pain Score 11/10/24 0737 6     Pain Loc --      Pain Education --      Exclude from Growth Chart --     Most recent vital signs: Vitals:   11/10/24 0909 11/10/24 0928  BP:    Pulse:  98  Resp:    Temp: 100.1 F (37.8 C)   SpO2:  99%   General: Awake, no distress.  CV:  Good peripheral perfusion. Tachycardia, regular rhythm Resp:  Normal effort. Rhonchi in right lung, left is CTA Abd:  No distention.  Other:     ED Results / Procedures / Treatments   Labs (all labs ordered are listed, but only abnormal results are displayed) Labs Reviewed  BASIC METABOLIC PANEL WITH GFR - Abnormal; Notable for the following components:      Result Value   Sodium 118 (*)    Chloride 82 (*)    CO2 20 (*)    Glucose, Bld 129 (*)    Anion gap 16 (*)    All other  components within normal limits  CBC WITH DIFFERENTIAL/PLATELET - Abnormal; Notable for the following components:   WBC 18.1 (*)    Neutro Abs 15.8 (*)    Monocytes Absolute 1.1 (*)    Basophils Absolute 0.2 (*)    Abs Immature Granulocytes 0.28 (*)    All other components within normal limits  PROTIME-INR - Abnormal; Notable for the following components:   Prothrombin Time 16.8 (*)    INR 1.3 (*)    All other components within normal limits  HEPATIC FUNCTION PANEL - Abnormal; Notable for the following components:   Total Bilirubin 2.0 (*)    Bilirubin, Direct 1.0 (*)    Indirect Bilirubin 1.0 (*)    All other components within normal limits  CULTURE, BLOOD (ROUTINE X 2)  CULTURE, BLOOD (ROUTINE X 2)  MRSA NEXT GEN BY PCR, NASAL  LACTIC ACID, PLASMA  LACTIC ACID, PLASMA  URINALYSIS, W/ REFLEX TO CULTURE (INFECTION SUSPECTED)  HIV ANTIBODY (ROUTINE TESTING W REFLEX)  PROCALCITONIN  STREP PNEUMONIAE URINARY ANTIGEN  LEGIONELLA PNEUMOPHILA SEROGP 1 UR AG     RADIOLOGY  Chest x-ray obtained, interpreted the images as well as reviewed the radiologist report which shows right lower lobe pneumonia.  PROCEDURES:  Critical Care performed: No  Procedures   MEDICATIONS ORDERED IN ED: Medications  azithromycin  (ZITHROMAX ) 500 mg in sodium chloride  0.9 % 250 mL IVPB (500 mg Intravenous New Bag/Given 11/10/24 0942)  cefTRIAXone  (ROCEPHIN ) 2 g in sodium chloride  0.9 % 100 mL IVPB (has no administration in time range)  azithromycin  (ZITHROMAX ) 500 mg in sodium chloride  0.9 % 250 mL IVPB (has no administration in time range)  enoxaparin  (LOVENOX ) injection 40 mg (has no administration in time range)  acetaminophen  (TYLENOL ) tablet 650 mg (has no administration in time range)    Or  acetaminophen  (TYLENOL ) suppository 650 mg (has no administration in time range)  oxyCODONE  (Oxy IR/ROXICODONE ) immediate release tablet 5 mg (has no administration in time range)  polyethylene glycol  (MIRALAX  / GLYCOLAX ) packet 17 g (has no administration in time range)  ondansetron  (ZOFRAN ) tablet 4 mg (has no administration in time range)    Or  ondansetron  (ZOFRAN ) injection 4 mg (has no administration in time range)  hydrocortisone  sodium succinate  (SOLU-CORTEF ) 100 MG injection 100 mg (has no administration in time range)  sodium chloride  0.9 % bolus 1,000 mL (0 mLs Intravenous Stopped 11/10/24 0936)  ibuprofen  (ADVIL ) tablet 800 mg (800 mg Oral Given 11/10/24 0756)  acetaminophen  (TYLENOL ) tablet 1,000 mg (1,000 mg Oral Given 11/10/24 0832)  cefTRIAXone  (ROCEPHIN ) 2 g in sodium chloride  0.9 % 100 mL IVPB (0 g Intravenous Stopped 11/10/24 0949)     IMPRESSION / MDM / ASSESSMENT AND PLAN / ED COURSE  I reviewed the triage vital signs and the nursing notes.                             38 year old male presents for evaluation of fever and cough.  Patient was febrile and tachycardic on initial presentation.  He is ill-appearing.  Differential diagnosis includes, but is not limited to, viral infection, pneumonia, bronchitis, reactive airway disease, addisonian crisis.  Patient's presentation is most consistent with acute complicated illness / injury requiring diagnostic workup.  Patient recently tested negative for flu and COVID so we will not repeat this swab.  Given his symptoms are worse and he is tachycardic and febrile will obtain chest x-ray to evaluate for pneumonia.  Will also obtain basic labs.  And start IV fluids for his tachycardia.  With patient's fever, tachycardia, leukocytosis and pneumonia patient meets sepsis criteria.  Sepsis order set initiated and IV antibiotics will be started.    Do not suspect addisonian crisis as patient is not hypotensive and does not have hypokalemia.  No eosinophilia.  Will plan to admit patient to hospital for treatment of sepsis, pneumonia and hyponatremia.  Hospitalist advised to give 100 mg of hydrocortisone .  Will start IV antibiotics as  well.  Patient stable at the time of admission.   Clinical Course as of 11/10/24 0952  Thu Nov 10, 2024  0943 CBC with Differential(!) Leukocytosis with neutrophilic predominance. [LD]  [id number] Hepatic function panel(!) Elevated bilirubin. [LD]  [phone] Basic metabolic panel(!!) Significant hyponatremia, may be from patient's Addison's disease as well as his diarrhea.  Also has an anion gap but no hypokalemia. [LD]  9051 Lactic acid, plasma Within normal limits. [LD]    Clinical Course User Index [LD] Cleaster Tinnie LABOR, PA-C     FINAL CLINICAL IMPRESSION(S) / ED DIAGNOSES   Final diagnoses:  Hyponatremia  Pneumonia of right lower lobe due to infectious organism     Rx / DC Orders   ED Discharge Orders     None        Note:  This document was prepared using Dragon voice recognition software and may include unintentional dictation errors.   Cleaster Tinnie LABOR, PA-C 11/10/24 9047    Levander Slate, MD 11/11/24 [phone]

## 2024-11-10 NOTE — H&P (Signed)
 History and Physical    Gregory Wood. FMW:[id number] DOB: October 25, 1986 DOA: 11/10/2024  DOS: the patient was seen and examined on 11/10/2024  PCP: No primary care provider on file.   Patient coming from: Home  I have personally briefly reviewed patient's old medical records in [facility]  Chief Complaint: Fever cough and shortness of breath for the last 4 days  HPI: Gregory Wood. is a pleasant 38 y.o. male with medical history significant for Addison's disease on daily Solu-Cortef 50 mg twice daily, who presented to ED for fever, body aches and chills started Monday.  He also complained of dry and nonproductive cough.  He stated that his temperature yesterday was 102.9.  He did not go see the doctor for 2 days.  Yesterday he went to urgent care where he was checked for flu and COVID which were negative.  Patient was asked to take over-the-counter medication to manage his symptoms.  This morning he came into ED complaining of worsening symptoms.  He also complained some nausea and 1 episode of vomiting and 2 episodes of diarrhea.  He denied any hematemesis or melena.  ED Course: Upon arrival to the ED, patient is found to be tachycardic at 115, temperature 101.9 F white count of 18.1, serum sodium level was 118, chest x-ray showed right lower lobe pneumonia.  Patient was given azithromycin, ceftriaxone, Lovenox, Zofran and IV fluid.  Hospitalist service was consulted for evaluation for admission.  6  Review of Systems:  ROS  All other systems negative except as noted in the HPI.  Past Medical History:  Diagnosis Date   Addison's disease [facility])     Past Surgical History:  Procedure Laterality Date   EPIGASTRIC HERNIA REPAIR  2012   FRACTURE SURGERY     ulnar   WISDOM TOOTH EXTRACTION       reports that he has never smoked. He has never used smokeless tobacco. He reports current drug use. Drug: Marijuana. He reports that he does not drink alcohol.  No Known  Allergies  Family History  Problem Relation Age of Onset   Hypertension Mother    Diabetes Father    Stroke Paternal Grandfather    COPD Neg Hx     Prior to Admission medications   Medication Sig Start Date End Date Taking? Authorizing Provider  albuterol  (VENTOLIN  HFA) 108 (90 Base) MCG/ACT inhaler Inhale 2 puffs into the lungs every 4 (four) hours as needed. 09/05/21   Bernardino Ditch, NP  benzonatate  (TESSALON ) 100 MG capsule Take 2 capsules (200 mg total) by mouth every 8 (eight) hours. 09/05/21   Bernardino Ditch, NP  hydrocortisone (CORTEF) 10 MG tablet Take 10 mg by mouth. Take 1.5 in the morning and 1 in the evening. 06/09/17   [provider]  ipratropium (ATROVENT ) 0.06 % nasal spray Place 2 sprays into both nostrils 4 (four) times daily. 09/05/21   Bernardino Ditch, NP  promethazine -dextromethorphan (PROMETHAZINE -DM) 6.25-15 MG/5ML syrup Take 5 mLs by mouth 4 (four) times daily as needed. 11/09/24   Defelice, Rilla, NP  Spacer/Aero-Holding Chambers (AEROCHAMBER MV) inhaler Use as instructed 09/05/21   Bernardino Ditch, NP    Physical Exam: Vitals:   11/10/24 0830 11/10/24 0900 11/10/24 0909 11/10/24 0928  BP:  115/70    Pulse: (!) 109 99  98  Resp:  20    Temp:   100.1 F (37.8 C)   TempSrc:   Oral   SpO2: 97% 98%  99%  Weight:  Physical Exam   Constitutional: Alert, awake, calm, comfortable, appears to be dry and dehydrated HEENT: Neck supple Respiratory: Bilateral decreased air entry at the bases Cardiovascular: Regular rate and rhythm, no murmurs / rubs / gallops. No extremity edema. 2+ pedal pulses. No carotid bruits.  Abdomen: Soft, no tenderness, Bowel sounds positive.  Musculoskeletal: no clubbing / cyanosis. Good ROM, no contractures. Normal muscle tone.  Skin: no rashes, lesions, ulcers. Neurologic: CN 2-12 grossly intact. Sensation intact, No focal deficit identified Psychiatric: Alert and oriented x 3. Normal mood.    Labs on Admission: I have personally  reviewed following labs and imaging studies  CBC: Recent Labs  Lab 11/10/24 0757  WBC 18.1*  NEUTROABS 15.8*  HGB 15.4  HCT 43.7  MCV 82.8  PLT 242   Basic Metabolic Panel: Recent Labs  Lab 11/10/24 0757  NA 118*  K 4.4  CL 82*  CO2 20*  GLUCOSE 129*  BUN 14  CREATININE 1.06  CALCIUM 9.0   GFR: CrCl cannot be calculated (Unknown ideal weight.). Liver Function Tests: Recent Labs  Lab 11/10/24 0757  AST 33  ALT 28  ALKPHOS 122  BILITOT 2.0*  PROT 8.0  ALBUMIN 4.2   No results for input(s): LIPASE, AMYLASE in the last 168 hours. No results for input(s): AMMONIA in the last 168 hours. Coagulation Profile: Recent Labs  Lab 11/10/24 0855  INR 1.3*   Cardiac Enzymes: No results for input(s): CKTOTAL, CKMB, CKMBINDEX, TROPONINI, TROPONINIHS in the last 168 hours. BNP (last 3 results) No results for input(s): BNP in the last 8760 hours. HbA1C: No results for input(s): HGBA1C in the last 72 hours. CBG: No results for input(s): GLUCAP in the last 168 hours. Lipid Profile: No results for input(s): CHOL, HDL, LDLCALC, TRIG, CHOLHDL, LDLDIRECT in the last 72 hours. Thyroid  Function Tests: No results for input(s): TSH, T4TOTAL, FREET4, T3FREE, THYROIDAB in the last 72 hours. Anemia Panel: No results for input(s): VITAMINB12, FOLATE, FERRITIN, TIBC, IRON, RETICCTPCT in the last 72 hours. Urine analysis:    Component Value Date/Time   COLORURINE YELLOW (A) 11/10/2024 0938   APPEARANCEUR HAZY (A) 11/10/2024 0938   APPEARANCEUR Clear 12/25/2014 1207   LABSPEC 1.005 11/10/2024 0938   LABSPEC 1.023 12/25/2014 1207   PHURINE 5.0 11/10/2024 0938   GLUCOSEU NEGATIVE 11/10/2024 0938   GLUCOSEU Negative 12/25/2014 1207   HGBUR SMALL (A) 11/10/2024 0938   BILIRUBINUR NEGATIVE 11/10/2024 0938   BILIRUBINUR Negative 12/25/2014 1207   KETONESUR 5 (A) 11/10/2024 0938   PROTEINUR 100 (A) 11/10/2024 0938   NITRITE  NEGATIVE 11/10/2024 0938   LEUKOCYTESUR NEGATIVE 11/10/2024 0938   LEUKOCYTESUR Negative 12/25/2014 1207    Radiological Exams on Admission: I have personally reviewed images DG Chest 2 View Result Date: 11/10/2024 CLINICAL DATA:  Fever and cough.  Evaluate for pneumonia. EXAM: DG CHEST 2V COMPARISON:  09/05/2021 FINDINGS: Lungs are adequately inflated with focal airspace consolidation over the posterior right lower lobe likely pneumonia. No effusion. Cardiomediastinal silhouette and remainder of the exam is unchanged. IMPRESSION: Right lower lobe pneumonia. Electronically Signed   By: Toribio Agreste M.D.   On: 11/10/2024 08:16    EKG: N/A    Assessment/Plan Principal Problem:   CAP (community acquired pneumonia) Active Problems:   Addison disease (HCC)   Hyponatremia    Assessment and Plan: 38 year old male with history of Addison's disease came in for flulike symptoms with right-sided pneumonia.  1.  Community-acquired pneumonia - He will be admitted to hospital as inpatient  as he has Addison's disease on a steroid - He is in immunocompromised due to chronic steroid use and there is a risk of worsening - He was started on ceftriaxone and azithromycin and also given 100 mg of IV hydrocortisone. - I will give him IV fluid hydration, continue ceftriaxone and azithromycin, follow-up blood cultures. - As he received a stress dose of hydrocortisone and blood pressure have been maintained, I will continue his home dose of hydrocortisone 15 mg twice daily. - He has a history of diarrhea nausea and vomiting and his sodium is low.  I believe he might have a Legionella pneumonia. - I will send procalcitonin, Legionella, Streptococcus antigen.  2.  Hyponatremia - Sodium level is 118 - Will give him IV fluid hydration - His mentation is normal - Will check BMP every 6 hours to make sure he does not get overcorrected - There is no obvious etiology to cause him chronic hyponatremia  3.   Chronic Addison's disease on oral hydrocortisone - Resume home dose of 15 mg hydrocortisone twice daily. - Patient received stress dose of hydrocortisone 100 mg IV in the ED - As his blood pressure is stable, will not increase the dose of hydrocortisone at this point - Will closely monitor his symptoms.       DVT prophylaxis: Lovenox Code Status: Full Code Family Communication: Wife was at bedside Disposition Plan: Home Consults called: None Admission status: Inpatient, Telemetry bed   Nena Rebel, MD [facility] 11/10/2024, 10:28 AM

## 2024-11-10 NOTE — ED Notes (Signed)
 See triage note, pt reports started feeling unwell on Monday. +fever, emesis. States tested neg for flu/covid panel at [facility] yesterday. Reports feeling dehydrated, currently tolerating fluids/drinking in room.

## 2024-11-10 NOTE — ED Triage Notes (Signed)
 Pt to ED via POV from home. Pt reports fever, bopdy aches and chills since Monday. Highest reading 102.9. Pt states he feels dehydrated. Pt reports has been drinking fluids. Pt seen at [facility] yesterday and tested positive for COVID/Flu.

## 2024-11-11 DIAGNOSIS — J189 Pneumonia, unspecified organism: Secondary | ICD-10-CM | POA: Diagnosis not present

## 2024-11-11 LAB — PROTIME-INR
INR: 1.1 (ref 0.8–1.2)
Prothrombin Time: 15 s (ref 11.4–15.2)

## 2024-11-11 LAB — BASIC METABOLIC PANEL WITH GFR
Anion gap: 16 — ABNORMAL HIGH (ref 5–15)
Anion gap: 14 (ref 5–15)
Anion gap: 15 (ref 5–15)
BUN: 12 mg/dL (ref 6–20)
BUN: 11 mg/dL (ref 6–20)
BUN: 10 mg/dL (ref 6–20)
CO2: 22 mmol/L (ref 22–32)
CO2: 23 mmol/L (ref 22–32)
CO2: 20 mmol/L — ABNORMAL LOW (ref 22–32)
Calcium: 8.9 mg/dL (ref 8.9–10.3)
Calcium: 8.4 mg/dL — ABNORMAL LOW (ref 8.9–10.3)
Calcium: 8.3 mg/dL — ABNORMAL LOW (ref 8.9–10.3)
Chloride: 88 mmol/L — ABNORMAL LOW (ref 98–111)
Chloride: 90 mmol/L — ABNORMAL LOW (ref 98–111)
Chloride: 94 mmol/L — ABNORMAL LOW (ref 98–111)
Creatinine, Ser: 1.02 mg/dL (ref 0.61–1.24)
Creatinine, Ser: 1 mg/dL (ref 0.61–1.24)
Creatinine, Ser: 0.84 mg/dL (ref 0.61–1.24)
GFR, Estimated: >60 mL/min (ref >60)
GFR, Estimated: >60 mL/min (ref >60)
GFR, Estimated: >60 mL/min (ref >60)
Glucose, Bld: 125 mg/dL — ABNORMAL HIGH (ref 70–99)
Glucose, Bld: 109 mg/dL — ABNORMAL HIGH (ref 70–99)
Glucose, Bld: 155 mg/dL — ABNORMAL HIGH (ref 70–99)
Potassium: 4.3 mmol/L (ref 3.5–5.1)
Potassium: 4 mmol/L (ref 3.5–5.1)
Potassium: 3.6 mmol/L (ref 3.5–5.1)
Sodium: 126 mmol/L — ABNORMAL LOW (ref 135–145)
Sodium: 127 mmol/L — ABNORMAL LOW (ref 135–145)
Sodium: 129 mmol/L — ABNORMAL LOW (ref 135–145)

## 2024-11-11 LAB — GASTROINTESTINAL PANEL BY PCR, STOOL (REPLACES STOOL CULTURE)

## 2024-11-11 LAB — C DIFFICILE QUICK SCREEN W PCR REFLEX
C Diff antigen: NEGATIVE
C Diff interpretation: NOT DETECTED
C Diff toxin: NEGATIVE

## 2024-11-11 LAB — RESPIRATORY PANEL BY PCR

## 2024-11-11 LAB — COMPREHENSIVE METABOLIC PANEL WITH GFR
ALT: 33 U/L (ref 0–44)
AST: 48 U/L — ABNORMAL HIGH (ref 15–41)
Albumin: 3.7 g/dL (ref 3.5–5.0)
Alkaline Phosphatase: 109 U/L (ref 38–126)
Anion gap: 13 (ref 5–15)
BUN: 13 mg/dL (ref 6–20)
CO2: 22 mmol/L (ref 22–32)
Calcium: 8.8 mg/dL — ABNORMAL LOW (ref 8.9–10.3)
Chloride: 89 mmol/L — ABNORMAL LOW (ref 98–111)
Creatinine, Ser: 0.95 mg/dL (ref 0.61–1.24)
GFR, Estimated: >60 mL/min (ref >60)
Glucose, Bld: 115 mg/dL — ABNORMAL HIGH (ref 70–99)
Potassium: 3.8 mmol/L (ref 3.5–5.1)
Sodium: 124 mmol/L — ABNORMAL LOW (ref 135–145)
Total Bilirubin: 0.9 mg/dL (ref 0.0–1.2)
Total Protein: 7.3 g/dL (ref 6.5–8.1)

## 2024-11-11 LAB — SARS CORONAVIRUS 2 BY RT PCR: SARS Coronavirus 2 by RT PCR: NEGATIVE

## 2024-11-11 LAB — CBC
HCT: 39 % (ref 39.0–52.0)
Hemoglobin: 13.7 g/dL (ref 13.0–17.0)
MCH: 29 pg (ref 26.0–34.0)
MCHC: 35.1 g/dL (ref 30.0–36.0)
MCV: 82.6 fL (ref 80.0–100.0)
Platelets: 226 K/uL (ref 150–400)
RBC: 4.72 MIL/uL (ref 4.22–5.81)
RDW: 11.8 % (ref 11.5–15.5)
WBC: 11.3 K/uL — ABNORMAL HIGH (ref 4.0–10.5)
nRBC: 0 % (ref 0.0–0.2)

## 2024-11-11 MED ORDER — SODIUM CHLORIDE 0.9 % IV SOLN: INTRAVENOUS | Status: AC

## 2024-11-11 MED ORDER — MELATONIN 5 MG PO TABS
5.0000 mg | ORAL_TABLET | Freq: Every evening | ORAL | Status: DC | PRN
  Administered 2024-11-11: 5 mg via ORAL
  Filled 2024-11-11: qty 1

## 2024-11-11 MED ORDER — HYDROCORTISONE SOD SUC (PF) 100 MG IJ SOLR
50.0000 mg | Freq: Four times a day (QID) | INTRAMUSCULAR | Status: AC
  Administered 2024-11-11 – 2024-11-12 (×4): 50 mg via INTRAVENOUS
  Filled 2024-11-11 (×4): qty 1

## 2024-11-11 MED ORDER — ZOLPIDEM TARTRATE 5 MG PO TABS
5.0000 mg | ORAL_TABLET | Freq: Every evening | ORAL | Status: DC | PRN
  Administered 2024-11-11: 5 mg via ORAL
  Filled 2024-11-11: qty 1

## 2024-11-11 MED ORDER — SODIUM CHLORIDE 0.9 % IV SOLN
500.0000 mg | INTRAVENOUS | Status: AC
  Administered 2024-11-12: 500 mg via INTRAVENOUS
  Filled 2024-11-11: qty 5

## 2024-11-11 MED ORDER — ZOLPIDEM TARTRATE 5 MG PO TABS
5.0000 mg | ORAL_TABLET | Freq: Every evening | ORAL | Status: DC | PRN
  Filled 2024-11-11: qty 1

## 2024-11-11 NOTE — Plan of Care (Signed)
  Problem: Education: Goal: Knowledge of General Education information will improve Description: Including pain rating scale, medication(s)/side effects and non-pharmacologic comfort measures Outcome: Progressing   Problem: Health Behavior/Discharge Planning: Goal: Ability to manage health-related needs will improve Outcome: Progressing   Problem: Clinical Measurements: Goal: Ability to maintain clinical measurements within normal limits will improve Outcome: Progressing Goal: Will remain free from infection Outcome: Progressing Goal: Diagnostic test results will improve Outcome: Progressing Goal: Respiratory complications will improve Outcome: Progressing Goal: Cardiovascular complication will be avoided Outcome: Progressing   Problem: Activity: Goal: Risk for activity intolerance will decrease Outcome: Progressing   Problem: Nutrition: Goal: Adequate nutrition will be maintained Outcome: Progressing   Problem: Coping: Goal: Level of anxiety will decrease Outcome: Progressing   Problem: Elimination: Goal: Will not experience complications related to bowel motility Outcome: Progressing Goal: Will not experience complications related to urinary retention Outcome: Progressing   Problem: Safety: Goal: Ability to remain free from injury will improve Outcome: Progressing   Problem: Pain Managment: Goal: General experience of comfort will improve and/or be controlled Outcome: Progressing   Problem: Skin Integrity: Goal: Risk for impaired skin integrity will decrease Outcome: Progressing   Problem: Activity: Goal: Ability to tolerate increased activity will improve Outcome: Progressing   Problem: Clinical Measurements: Goal: Ability to maintain a body temperature in the normal range will improve Outcome: Progressing   Problem: Respiratory: Goal: Ability to maintain adequate ventilation will improve Outcome: Progressing Goal: Ability to maintain a clear airway  will improve Outcome: Progressing

## 2024-11-11 NOTE — Progress Notes (Signed)
 PROGRESS NOTE    Gregory Wood.  FMW:[id number] DOB: 05-15-1986 DOA: 11/10/2024 PCP: No primary care provider on file.  Outpatient Specialists: endo    Brief Narrative:   From admission h and p  Gregory Wood. is a pleasant 38 y.o. male with medical history significant for Addison's disease on daily Solu-Cortef  50 mg twice daily, who presented to ED for fever, body aches and chills started Monday.  He also complained of dry and nonproductive cough.  He stated that his temperature yesterday was 102.9.  He did not go see the doctor for 2 days.  Yesterday he went to urgent care where he was checked for flu and COVID which were negative.  Patient was asked to take over-the-counter medication to manage his symptoms.  This morning he came into ED complaining of worsening symptoms.  He also complained some nausea and 1 episode of vomiting and 2 episodes of diarrhea.  He denied any hematemesis or melena.     Assessment & Plan:   Principal Problem:   CAP (community acquired pneumonia) Active Problems:   Addison disease (HCC)   Hyponatremia  # CAP Several days cough, chills, fatigue. RLL infiltrate seen on CXR. Covid/flu/rsv neg few days ago. Some improvement today. Strep antigen neg. Not producing sputum for culture - follow blood cultures - continue ceftriaxone /azithromycin  - rvp  # Diarrhea One episode yesterday. No abd pain - test if recurs  # Sepsis By fever, leukocytosis. Continues to have fevers. WBC improving - continue fluids, abx, anti-pyretics  # Chronic adrenal insufficiency # Hyponatremia Does not appear to be in adrenal crisis but is acutely ill with hyponatremia. Baseline is around 135, was 118 here, improved to 124 today. Received 100 mg of iv hydrocortisone  yesterday. - discontinue home dose of hydrocortisone , think too early to resume that - will given hydrocortisone  50 q6 for the next 24 hours, likely convert to orals tomorrow though would taper down  to home dose over the next few days - continue IVF   DVT prophylaxis: lovenox  Code Status: full Family Communication: none at bedside  Level of care: Telemetry Status is: Inpatient Remains inpatient appropriate because: severity of illness, ongoing hyponatremia    Consultants:  none  Procedures: none  Antimicrobials:  Ceftriaxone /azithromycin     Subjective: Reports mild interval improvement in malaise  Objective: Vitals:   11/11/24 0227 11/11/24 0347 11/11/24 0557 11/11/24 0915  BP:   116/83 (!) 118/53  Pulse:   (!) 101 (!) 108  Resp:   18   Temp: (!) 102 F (38.9 C) (!) 100.7 F (38.2 C) 99.4 F (37.4 C) (!) 102.9 F (39.4 C)  TempSrc: Oral Oral Oral   SpO2:   94% 95%  Weight:      Height:        Intake/Output Summary (Last 24 hours) at 11/11/2024 0951 Last data filed at 11/10/2024 1606 Gross per 24 hour  Intake 0 ml  Output --  Net 0 ml   Filed Weights   11/10/24 0738  Weight: 67.1 kg    Examination:  General exam: Appears calm and comfortable  Respiratory system: rales RLL Cardiovascular system: S1 & S2 heard, RRR. No JVD, murmurs, rubs, gallops or clicks. No pedal edema. Gastrointestinal system: Abdomen is nondistended, soft and nontender. No organomegaly or masses felt. Normal bowel sounds heard. Central nervous system: Alert and oriented. No focal neurological deficits. Extremities: Symmetric 5 x 5 power. Skin: No rashes, lesions or ulcers Psychiatry: Judgement and insight appear normal.  Mood & affect appropriate.     Data Reviewed: I have personally reviewed following labs and imaging studies  CBC: Recent Labs  Lab 11/10/24 0757 11/11/24 0503  WBC 18.1* 11.3*  NEUTROABS 15.8*  --   HGB 15.4 13.7  HCT 43.7 39.0  MCV 82.8 82.6  PLT 242 226   Basic Metabolic Panel: Recent Labs  Lab 11/10/24 0757 11/10/24 1058 11/10/24 1616 11/10/24 2313 11/11/24 0503  NA 118* 123* 127* 126* 124*  K 4.4 4.1 4.5 4.3 3.8  CL 82* 89* 90* 88*  89*  CO2 20* 19* 22 22 22   GLUCOSE 129* 133* 111* 125* 115*  BUN 14 14 14 12 13   CREATININE 1.06 1.03 1.19 1.02 0.95  CALCIUM 9.0 8.3* 9.1 8.9 8.8*   GFR: Estimated Creatinine Clearance: 93.3 mL/min (by C-G formula based on SCr of 0.95 mg/dL). Liver Function Tests: Recent Labs  Lab 11/10/24 0757 11/11/24 0503  AST 33 48*  ALT 28 33  ALKPHOS 122 109  BILITOT 2.0* 0.9  PROT 8.0 7.3  ALBUMIN 4.2 3.7   No results for input(s): LIPASE, AMYLASE in the last 168 hours. No results for input(s): AMMONIA in the last 168 hours. Coagulation Profile: Recent Labs  Lab 11/10/24 0855 11/11/24 0503  INR 1.3* 1.1   Cardiac Enzymes: No results for input(s): CKTOTAL, CKMB, CKMBINDEX, TROPONINI in the last 168 hours. BNP (last 3 results) No results for input(s): PROBNP in the last 8760 hours. HbA1C: No results for input(s): HGBA1C in the last 72 hours. CBG: No results for input(s): GLUCAP in the last 168 hours. Lipid Profile: No results for input(s): CHOL, HDL, LDLCALC, TRIG, CHOLHDL, LDLDIRECT in the last 72 hours. Thyroid  Function Tests: No results for input(s): TSH, T4TOTAL, FREET4, T3FREE, THYROIDAB in the last 72 hours. Anemia Panel: No results for input(s): VITAMINB12, FOLATE, FERRITIN, TIBC, IRON, RETICCTPCT in the last 72 hours. Urine analysis:    Component Value Date/Time   COLORURINE YELLOW (A) 11/10/2024 0938   APPEARANCEUR HAZY (A) 11/10/2024 0938   APPEARANCEUR Clear 12/25/2014 1207   LABSPEC 1.005 11/10/2024 0938   LABSPEC 1.023 12/25/2014 1207   PHURINE 5.0 11/10/2024 0938   GLUCOSEU NEGATIVE 11/10/2024 0938   GLUCOSEU Negative 12/25/2014 1207   HGBUR SMALL (A) 11/10/2024 0938   BILIRUBINUR NEGATIVE 11/10/2024 0938   BILIRUBINUR Negative 12/25/2014 1207   KETONESUR 5 (A) 11/10/2024 0938   PROTEINUR 100 (A) 11/10/2024 0938   NITRITE NEGATIVE 11/10/2024 0938   LEUKOCYTESUR NEGATIVE 11/10/2024 0938    LEUKOCYTESUR Negative 12/25/2014 1207   Sepsis Labs: @LABRCNTIP (procalcitonin:4,lacticidven:4)  ) Recent Results (from the past 240 hours)  MRSA Next Gen by PCR, Nasal     Status: None   Collection Time: 11/10/24  5:00 PM   Specimen: Nasal Mucosa; Nasal Swab  Result Value Ref Range Status   MRSA by PCR Next Gen NOT DETECTED NOT DETECTED Final    Comment: (NOTE) The GeneXpert MRSA Assay (FDA approved for NASAL specimens only), is one component of a comprehensive MRSA colonization surveillance program. It is not intended to diagnose MRSA infection nor to guide or monitor treatment for MRSA infections. Test performance is not FDA approved in patients less than 76 years old. Performed at [facility], [address], [place], [place] [address]          Radiology Studies: DG Chest 2 View Result Date: 11/10/2024 CLINICAL DATA:  Fever and cough.  Evaluate for pneumonia. EXAM: DG CHEST 2V COMPARISON:  09/05/2021 FINDINGS: Lungs are adequately  inflated with focal airspace consolidation over the posterior right lower lobe likely pneumonia. No effusion. Cardiomediastinal silhouette and remainder of the exam is unchanged. IMPRESSION: Right lower lobe pneumonia. Electronically Signed   By: Toribio Agreste M.D.   On: 11/10/2024 08:16        Scheduled Meds:  enoxaparin  (LOVENOX ) injection  40 mg Subcutaneous Q24H   hydrocortisone  sod succinate (SOLU-CORTEF ) inj  50 mg Intravenous Q6H   pantoprazole  (PROTONIX ) IV  40 mg Intravenous Q12H   Continuous Infusions:  azithromycin  500 mg (11/11/24 0850)   cefTRIAXone  (ROCEPHIN )  IV       LOS: 1 day     Gregory KATHEE Ban, MD [facility]   If 7PM-7AM, please contact night-coverage [email or web address] Password [email or web address] 11/11/2024, 9:51 AM

## 2024-11-11 NOTE — TOC CM/SW Note (Signed)
 Transition of Care [facility]) - Inpatient Brief Assessment   Patient Details  Name: Kailash Hinze. MRN: [id number] Date of Birth: 01-14-1986  Transition of Care [facility]) CM/SW Contact:    Corean ONEIDA Haddock, RN Phone Number: 11/11/2024, 10:09 AM   Clinical Narrative:   Transition of Care [facility]) Screening Note   Patient Details  Name: Pericles Carmicheal. Date of Birth: May 11, 1986   Transition of Care [facility]) CM/SW Contact:    Corean ONEIDA Haddock, RN Phone Number: 11/11/2024, 10:09 AM    Transition of Care Department [facility]) has reviewed patient and no TOC needs have been identified at this time.  If new patient transition needs arise, please place a TOC consult.    Transition of Care Asessment: Insurance and Status: Insurance coverage has been reviewed Patient has primary care physician: Yes (Per Care everywhere initial app with [place] 10/07/24)     Prior/Current Home Services: No current home services Social Drivers of Health Review: SDOH reviewed no interventions necessary Readmission risk has been reviewed: Yes Transition of care needs: no transition of care needs at this time

## 2024-11-12 DIAGNOSIS — A419 Sepsis, unspecified organism: Secondary | ICD-10-CM | POA: Insufficient documentation

## 2024-11-12 DIAGNOSIS — A481 Legionnaires' disease: Secondary | ICD-10-CM

## 2024-11-12 LAB — CBC
HCT: 34.9 % — ABNORMAL LOW (ref 39.0–52.0)
Hemoglobin: 12.4 g/dL — ABNORMAL LOW (ref 13.0–17.0)
MCH: 29.2 pg (ref 26.0–34.0)
MCHC: 35.5 g/dL (ref 30.0–36.0)
MCV: 82.1 fL (ref 80.0–100.0)
Platelets: 243 K/uL (ref 150–400)
RBC: 4.25 MIL/uL (ref 4.22–5.81)
RDW: 12.1 % (ref 11.5–15.5)
WBC: 11.8 K/uL — ABNORMAL HIGH (ref 4.0–10.5)
nRBC: 0 % (ref 0.0–0.2)

## 2024-11-12 LAB — BASIC METABOLIC PANEL WITH GFR
Anion gap: 13 (ref 5–15)
BUN: 11 mg/dL (ref 6–20)
CO2: 22 mmol/L (ref 22–32)
Calcium: 8.3 mg/dL — ABNORMAL LOW (ref 8.9–10.3)
Chloride: 99 mmol/L (ref 98–111)
Creatinine, Ser: 0.68 mg/dL (ref 0.61–1.24)
GFR, Estimated: >60 mL/min (ref >60)
Glucose, Bld: 141 mg/dL — ABNORMAL HIGH (ref 70–99)
Potassium: 3.4 mmol/L — ABNORMAL LOW (ref 3.5–5.1)
Sodium: 133 mmol/L — ABNORMAL LOW (ref 135–145)

## 2024-11-12 MED ORDER — AZITHROMYCIN 500 MG PO TABS: 500.0000 mg | ORAL_TABLET | Freq: Every day | ORAL | 0 refills | Status: AC

## 2024-11-12 MED ORDER — AZITHROMYCIN 250 MG PO TABS
500.0000 mg | ORAL_TABLET | Freq: Every day | ORAL | Status: DC
  Administered 2024-11-12: 500 mg via ORAL
  Filled 2024-11-12: qty 2

## 2024-11-12 NOTE — Discharge Summary (Signed)
 Gregory Wood. FMW:[id number] DOB: 13-May-1986 DOA: 11/10/2024  PCP: No primary care provider on file.  Admit date: 11/10/2024 Discharge date: 11/12/2024  Time spent: 35 minutes  Recommendations for Outpatient Follow-up:  Pcp f/u 1 week, check sodium then     Discharge Diagnoses:  Principal Problem:   Legionella pneumonia (HCC) Active Problems:   Addison disease (HCC)   Hyponatremia   Sepsis (HCC)   Discharge Condition: improved  Diet recommendation: regular  Filed Weights   11/10/24 0738  Weight: 67.1 kg    History of present illness:  From admission Gregory Wood. is a pleasant 38 y.o. male with medical history significant for Addison's disease on daily Solu-Cortef  50 mg twice daily, who presented to ED for fever, body aches and chills started Monday.  He also complained of dry and nonproductive cough.  He stated that his temperature yesterday was 102.9.  He did not go see the doctor for 2 days.  Yesterday he went to urgent care where he was checked for flu and COVID which were negative.  Patient was asked to take over-the-counter medication to manage his symptoms.  This morning he came into ED complaining of worsening symptoms.  He also complained some nausea and 1 episode of vomiting and 2 episodes of diarrhea.  He denied any hematemesis or melena.    Hospital Course:   # Legionella pneumonia Several days cough, chills, fatigue. RLL infiltrate seen on CXR. Covid/flu/rsv neg few days ago. Legionella antigen positive. improving - 10 days total azithromycin  per ID given immunosuppression  # Diarrhea One episode yesterday. Likely 2/2 legionella. Stool testing negative.  # Sepsis By fever, leukocytosis. resolved   # Chronic adrenal insufficiency # Hyponatremia Does not appear to be in adrenal crisis but is acutely ill with hyponatremia. Baseline is around 135, was 118 here, improved to 135 with stress-dose steroids and treating underlying lung disease -  double home steroids next two days - pcp f/u 1 week check sodium then  Procedures: none   Consultations: none  Discharge Exam: Vitals:   11/12/24 0528 11/12/24 0758  BP: 119/74 119/80  Pulse: 98 80  Resp: 16 16  Temp: 98.4 F (36.9 C) 99 F (37.2 C)  SpO2: 96% 94%    General: nad Cardiovascular: rrr Respiratory: few rhonchi, normal wob  Discharge Instructions   Discharge Instructions     Diet - low sodium heart healthy   Complete by: As directed    Increase activity slowly   Complete by: As directed       Allergies as of 11/12/2024   No Known Allergies      Medication List     TAKE these medications    AeroChamber MV inhaler Use as instructed   albuterol  108 (90 Base) MCG/ACT inhaler Commonly known as: VENTOLIN  HFA Inhale 2 puffs into the lungs every 4 (four) hours as needed.   azithromycin  500 MG tablet Commonly known as: Zithromax  Take 1 tablet (500 mg total) by mouth daily for 7 days.   benzonatate  100 MG capsule Commonly known as: TESSALON  Take 2 capsules (200 mg total) by mouth every 8 (eight) hours.   hydrocortisone  10 MG tablet Commonly known as: CORTEF  Take 10 mg by mouth. Take 1.5 in the morning and 1 in the evening.   ipratropium 0.06 % nasal spray Commonly known as: ATROVENT  Place 2 sprays into both nostrils 4 (four) times daily.   promethazine -dextromethorphan  6.25-15 MG/5ML syrup Commonly known as: PROMETHAZINE -DM Take 5 mLs by mouth  4 (four) times daily as needed.       No Known Allergies  Follow-up Information     your primary care provider Follow up.   Why: in about 1 week                 The results of significant diagnostics from this hospitalization (including imaging, microbiology, ancillary and laboratory) are listed below for reference.    Significant Diagnostic Studies: DG Chest 2 View Result Date: 11/10/2024 CLINICAL DATA:  Fever and cough.  Evaluate for pneumonia. EXAM: DG CHEST 2V COMPARISON:   09/05/2021 FINDINGS: Lungs are adequately inflated with focal airspace consolidation over the posterior right lower lobe likely pneumonia. No effusion. Cardiomediastinal silhouette and remainder of the exam is unchanged. IMPRESSION: Right lower lobe pneumonia. Electronically Signed   By: Toribio Agreste M.D.   On: 11/10/2024 08:16    Microbiology: Recent Results (from the past 240 hours)  Blood Culture (routine x 2)     Status: None (Preliminary result)   Collection Time: 11/10/24  8:54 AM   Specimen: BLOOD  Result Value Ref Range Status   Specimen Description BLOOD BLOOD LEFT WRIST  Final   Special Requests   Final    BOTTLES DRAWN AEROBIC AND ANAEROBIC Blood Culture adequate volume   Culture   Final    NO GROWTH 2 DAYS Performed at [facility], [address] [place], [place] [address]    Report Status PENDING  Incomplete  Blood Culture (routine x 2)     Status: None (Preliminary result)   Collection Time: 11/10/24  8:57 AM   Specimen: BLOOD  Result Value Ref Range Status   Specimen Description BLOOD BLOOD RIGHT HAND  Final   Special Requests   Final    BOTTLES DRAWN AEROBIC AND ANAEROBIC Blood Culture results may not be optimal due to an inadequate volume of blood received in culture bottles   Culture   Final    NO GROWTH 2 DAYS Performed at [facility], [address] [place], [place] [address]    Report Status PENDING  Incomplete  MRSA Next Gen by PCR, Nasal     Status: None   Collection Time: 11/10/24  5:00 PM   Specimen: Nasal Mucosa; Nasal Swab  Result Value Ref Range Status   MRSA by PCR Next Gen NOT DETECTED NOT DETECTED Final    Comment: (NOTE) The GeneXpert MRSA Assay (FDA approved for NASAL specimens only), is one component of a comprehensive MRSA colonization surveillance program. It is not intended to diagnose MRSA infection nor to guide or monitor treatment for MRSA infections. Test performance is not FDA approved in patients less than 30  years old. Performed at [facility], [address] [place], [place] [address]   Respiratory (~20 pathogens) panel by PCR     Status: None   Collection Time: 11/11/24  9:37 AM   Specimen: Nasopharyngeal Swab; Respiratory  Result Value Ref Range Status   Adenovirus NOT DETECTED NOT DETECTED Final   Coronavirus 229E NOT DETECTED NOT DETECTED Final    Comment: (NOTE) The Coronavirus on the Respiratory Panel, DOES NOT test for the novel  Coronavirus (2019 nCoV)    Coronavirus HKU1 NOT DETECTED NOT DETECTED Final   Coronavirus NL63 NOT DETECTED NOT DETECTED Final   Coronavirus OC43 NOT DETECTED NOT DETECTED Final   Metapneumovirus NOT DETECTED NOT DETECTED Final   Rhinovirus / Enterovirus NOT DETECTED NOT DETECTED Final   Influenza A NOT DETECTED NOT  DETECTED Final   Influenza B NOT DETECTED NOT DETECTED Final   Parainfluenza Virus 1 NOT DETECTED NOT DETECTED Final   Parainfluenza Virus 2 NOT DETECTED NOT DETECTED Final   Parainfluenza Virus 3 NOT DETECTED NOT DETECTED Final   Parainfluenza Virus 4 NOT DETECTED NOT DETECTED Final   Respiratory Syncytial Virus NOT DETECTED NOT DETECTED Final   Bordetella pertussis NOT DETECTED NOT DETECTED Final   Bordetella Parapertussis NOT DETECTED NOT DETECTED Final   Chlamydophila pneumoniae NOT DETECTED NOT DETECTED Final   Mycoplasma pneumoniae NOT DETECTED NOT DETECTED Final    Comment: Performed at [facility], [address] [place], [place] [address]  SARS Coronavirus 2 by RT PCR (hospital order, performed in [facility] hospital lab) *cepheid single result test*     Status: None   Collection Time: 11/11/24  9:37 AM  Result Value Ref Range Status   SARS Coronavirus 2 by RT PCR NEGATIVE NEGATIVE Final    Comment: (NOTE) SARS-CoV-2 target nucleic acids are NOT DETECTED.  The SARS-CoV-2 RNA is generally detectable in upper and lower respiratory specimens during the acute phase of infection. The lowest concentration of  SARS-CoV-2 viral copies this assay can detect is 250 copies / mL. A negative result does not preclude SARS-CoV-2 infection and should not be used as the sole basis for treatment or other patient management decisions.  A negative result may occur with improper specimen collection / handling, submission of specimen other than nasopharyngeal swab, presence of viral mutation(s) within the areas targeted by this assay, and inadequate number of viral copies (<250 copies / mL). A negative result must be combined with clinical observations, patient history, and epidemiological information.  Fact Sheet for Patients:   roadlaptop.co.za  Fact Sheet for Healthcare Providers: http://kim-miller.com/  This test is not yet approved or  cleared by the [place]  FDA and has been authorized for detection and/or diagnosis of SARS-CoV-2 by FDA under an Emergency Use Authorization (EUA).  This EUA will remain in effect (meaning this test can be used) for the duration of the COVID-19 declaration under Section 564(b)(1) of the Act, 21 U.S.C. section 360bbb-3(b)(1), unless the authorization is terminated or revoked sooner.  Performed at [facility], [address] [place], [place] [address]   C Difficile Quick Screen w PCR reflex     Status: None   Collection Time: 11/11/24  9:59 AM   Specimen: Stool  Result Value Ref Range Status   C Diff antigen NEGATIVE NEGATIVE Final   C Diff toxin NEGATIVE NEGATIVE Final   C Diff interpretation No C. difficile detected.  Final    Comment: Performed at [facility], [address] [place], [place] [address]  Gastrointestinal Panel by PCR , Stool     Status: None   Collection Time: 11/11/24  9:59 AM   Specimen: Stool  Result Value Ref Range Status   Campylobacter species NOT DETECTED NOT DETECTED Final   Plesimonas shigelloides NOT DETECTED NOT DETECTED Final   Salmonella species NOT DETECTED  NOT DETECTED Final   Yersinia enterocolitica NOT DETECTED NOT DETECTED Final   Vibrio species NOT DETECTED NOT DETECTED Final   Vibrio cholerae NOT DETECTED NOT DETECTED Final   Enteroaggregative E coli (EAEC) NOT DETECTED NOT DETECTED Final   Enteropathogenic E coli (EPEC) NOT DETECTED NOT DETECTED Final   Enterotoxigenic E coli (ETEC) NOT DETECTED NOT DETECTED Final   Shiga like toxin producing E coli (STEC) NOT DETECTED NOT DETECTED Final  Shigella/Enteroinvasive E coli (EIEC) NOT DETECTED NOT DETECTED Final   Cryptosporidium NOT DETECTED NOT DETECTED Final   Cyclospora cayetanensis NOT DETECTED NOT DETECTED Final   Entamoeba histolytica NOT DETECTED NOT DETECTED Final   Giardia lamblia NOT DETECTED NOT DETECTED Final   Adenovirus F40/41 NOT DETECTED NOT DETECTED Final   Astrovirus NOT DETECTED NOT DETECTED Final   Norovirus GI/GII NOT DETECTED NOT DETECTED Final   Rotavirus A NOT DETECTED NOT DETECTED Final   Sapovirus (I, II, IV, and V) NOT DETECTED NOT DETECTED Final    Comment: Performed at [facility], [address] [place], [place] [address]     Labs: Basic Metabolic Panel: Recent Labs  Lab 11/10/24 2313 11/11/24 0503 11/11/24 1110 11/11/24 1707 11/12/24 0803  NA 126* 124* 127* 129* 133*  K 4.3 3.8 4.0 3.6 3.4*  CL 88* 89* 90* 94* 99  CO2 22 22 23  20* 22  GLUCOSE 125* 115* 109* 155* 141*  BUN 12 13 11 10 11   CREATININE 1.02 0.95 1.00 0.84 0.68  CALCIUM 8.9 8.8* 8.4* 8.3* 8.3*   Liver Function Tests: Recent Labs  Lab 11/10/24 0757 11/11/24 0503  AST 33 48*  ALT 28 33  ALKPHOS 122 109  BILITOT 2.0* 0.9  PROT 8.0 7.3  ALBUMIN 4.2 3.7   No results for input(s): LIPASE, AMYLASE in the last 168 hours. No results for input(s): AMMONIA in the last 168 hours. CBC: Recent Labs  Lab 11/10/24 0757 11/11/24 0503 11/12/24 0803  WBC 18.1* 11.3* 11.8*  NEUTROABS 15.8*  --   --   HGB 15.4 13.7 12.4*  HCT 43.7 39.0 34.9*  MCV 82.8 82.6 82.1   PLT 242 226 243   Cardiac Enzymes: No results for input(s): CKTOTAL, CKMB, CKMBINDEX, TROPONINI in the last 168 hours. BNP: BNP (last 3 results) No results for input(s): BNP in the last 8760 hours.  ProBNP (last 3 results) No results for input(s): PROBNP in the last 8760 hours.  CBG: No results for input(s): GLUCAP in the last 168 hours.     Signed:  Devaughn KATHEE Ban MD.  [facility] 11/12/2024, 11:46 AM

## 2024-11-12 NOTE — Plan of Care (Signed)

## 2024-11-12 NOTE — Discharge Instructions (Signed)
 Double your dose of hydrocortisone  for the next two days

## 2024-11-15 LAB — CULTURE, BLOOD (ROUTINE X 2)
Culture: NO GROWTH
Culture: NO GROWTH
Special Requests: ADEQUATE

## 2024-11-15 LAB — LEGIONELLA PNEUMOPHILA SEROGP 1 UR AG: L. pneumophila Serogp 1 Ur Ag: POSITIVE — AB
